# Patient Record
Sex: Female | Born: 1975 | Race: Black or African American | Hispanic: No | Marital: Single | Smoking: Current every day smoker
Health system: Southern US, Community
[De-identification: ages and names within clinical notes are randomized; demographics above are authoritative.]

## PROBLEM LIST (undated history)

## (undated) ENCOUNTER — Inpatient Hospital Stay (HOSPITAL_COMMUNITY): Payer: BC Managed Care – PPO

## (undated) DIAGNOSIS — F329 Major depressive disorder, single episode, unspecified: Secondary | ICD-10-CM

## (undated) DIAGNOSIS — D219 Benign neoplasm of connective and other soft tissue, unspecified: Secondary | ICD-10-CM

## (undated) DIAGNOSIS — IMO0002 Reserved for concepts with insufficient information to code with codable children: Secondary | ICD-10-CM

## (undated) DIAGNOSIS — I10 Essential (primary) hypertension: Secondary | ICD-10-CM

## (undated) DIAGNOSIS — R87619 Unspecified abnormal cytological findings in specimens from cervix uteri: Secondary | ICD-10-CM

## (undated) DIAGNOSIS — F32A Depression, unspecified: Secondary | ICD-10-CM

## (undated) DIAGNOSIS — E785 Hyperlipidemia, unspecified: Secondary | ICD-10-CM

## (undated) DIAGNOSIS — N390 Urinary tract infection, site not specified: Secondary | ICD-10-CM

## (undated) HISTORY — DX: Essential (primary) hypertension: I10

## (undated) HISTORY — PX: NO PAST SURGERIES: SHX2092

## (undated) HISTORY — DX: Depression, unspecified: F32.A

## (undated) HISTORY — PX: CHOLECYSTECTOMY: SHX55

## (undated) HISTORY — DX: Major depressive disorder, single episode, unspecified: F32.9

## (undated) HISTORY — DX: Urinary tract infection, site not specified: N39.0

## (undated) HISTORY — DX: Hyperlipidemia, unspecified: E78.5

---

## 1999-02-01 HISTORY — PX: CHOLECYSTECTOMY: SHX55

## 1999-11-02 ENCOUNTER — Other Ambulatory Visit: Admission: RE | Admit: 1999-11-02 | Discharge: 1999-11-02 | Payer: Self-pay | Admitting: Family Medicine

## 2000-03-31 ENCOUNTER — Other Ambulatory Visit: Admission: RE | Admit: 2000-03-31 | Discharge: 2000-03-31 | Payer: Self-pay | Admitting: Obstetrics and Gynecology

## 2000-09-15 ENCOUNTER — Other Ambulatory Visit: Admission: RE | Admit: 2000-09-15 | Discharge: 2000-09-15 | Payer: Self-pay | Admitting: Obstetrics and Gynecology

## 2000-09-15 ENCOUNTER — Inpatient Hospital Stay (HOSPITAL_COMMUNITY): Admission: AD | Admit: 2000-09-15 | Discharge: 2000-09-15 | Payer: Self-pay | Admitting: *Deleted

## 2000-10-09 ENCOUNTER — Inpatient Hospital Stay (HOSPITAL_COMMUNITY): Admission: AD | Admit: 2000-10-09 | Discharge: 2000-10-11 | Payer: Self-pay | Admitting: Obstetrics and Gynecology

## 2000-10-13 ENCOUNTER — Encounter: Payer: Self-pay | Admitting: Obstetrics and Gynecology

## 2000-10-13 ENCOUNTER — Inpatient Hospital Stay (HOSPITAL_COMMUNITY): Admission: AD | Admit: 2000-10-13 | Discharge: 2000-10-15 | Payer: Self-pay | Admitting: Obstetrics and Gynecology

## 2000-10-14 ENCOUNTER — Encounter: Payer: Self-pay | Admitting: Obstetrics and Gynecology

## 2000-11-20 ENCOUNTER — Encounter (INDEPENDENT_AMBULATORY_CARE_PROVIDER_SITE_OTHER): Payer: Self-pay | Admitting: Specialist

## 2000-11-20 ENCOUNTER — Observation Stay (HOSPITAL_COMMUNITY): Admission: RE | Admit: 2000-11-20 | Discharge: 2000-11-20 | Payer: Self-pay | Admitting: Surgery

## 2000-11-20 ENCOUNTER — Encounter: Payer: Self-pay | Admitting: Surgery

## 2001-02-20 ENCOUNTER — Encounter: Payer: Self-pay | Admitting: *Deleted

## 2001-02-20 ENCOUNTER — Emergency Department (HOSPITAL_COMMUNITY): Admission: EM | Admit: 2001-02-20 | Discharge: 2001-02-20 | Payer: Self-pay | Admitting: *Deleted

## 2004-07-05 ENCOUNTER — Ambulatory Visit (HOSPITAL_COMMUNITY): Admission: RE | Admit: 2004-07-05 | Discharge: 2004-07-05 | Payer: Self-pay | Admitting: Family Medicine

## 2006-11-07 ENCOUNTER — Other Ambulatory Visit: Admission: RE | Admit: 2006-11-07 | Discharge: 2006-11-07 | Payer: Self-pay | Admitting: Family Medicine

## 2007-03-12 ENCOUNTER — Inpatient Hospital Stay (HOSPITAL_COMMUNITY): Admission: AD | Admit: 2007-03-12 | Discharge: 2007-03-12 | Payer: Self-pay | Admitting: Obstetrics & Gynecology

## 2007-12-13 ENCOUNTER — Other Ambulatory Visit: Admission: RE | Admit: 2007-12-13 | Discharge: 2007-12-13 | Payer: Self-pay | Admitting: Family Medicine

## 2009-10-27 ENCOUNTER — Other Ambulatory Visit: Admission: RE | Admit: 2009-10-27 | Discharge: 2009-10-27 | Payer: Self-pay | Admitting: Family Medicine

## 2010-06-18 NOTE — H&P (Signed)
[facility]  Patient:    Katelyn Zimmerman, BETTENHAUSEN Visit Number: [id number] MRN: [id number]          Service Type: MED Location: [facility] Attending Physician:  Jaymes Graff A Dictated by:   Nigel Bridgeman, C.N.M. Admit Date:  10/13/2000                           History and Physical  HISTORY OF PRESENT ILLNESS:   The patient is a 35 year old, gravida 3, para 2-0-1-2, at four days post spontaneous vaginal delivery and tubal ligation, who presented tonight with a history of elevated blood pressure at home, and shortness of breath.  Her maternity care coordinator had seen her today and she had had a blood pressure of 170/110.  The patient was directed to maternity admissions for serial blood pressures and further evaluation.  She reports she has had some mild sporadic epigastric pain. She denies chest pain, cramping, heavy bleeding, or dysuria.  She is bottle feeding.  The patient is currently accompanied by her newborn infant, her older child, and the son of the father of the baby.  Due to the patients social situation, she has no other child care to assist. I did speak tonight with both the father of the baby, who is currently in [place], and his sister, who is living in [place]. The father of the baby will be coming to [facility] tomorrow evening at 9 p.m. on a flight.  The father of the baby reports that his sister will come tomorrow from [place] to assist with child care.  Currently tonight, the three children will remain with the parent, and arrangements were made for feeding and sleeping arrangements for the children.  Her pregnancy and medical history have been remarkable for; (1) mild shoulder dystocia at her delivery, (2) tubal ligation on day #1 postpartum.  OBSTETRICAL HISTORY:          The patient has had one previous full term delivery and one previous AB.  HISTORY OF PREGNANCY:         The patient entered care in the first trimester. She had  an essentially uncomplicated pregnancy except for Chlamydia in the first trimester, which was treated. She had a negative test of cure in the third trimester.  She had a spontaneous vaginal birth on October 09, 2000, with delivery of a viable female.  There was mild shoulder dystocia.  The patient had an uncomplicated postpartum course.  PAST MEDICAL HISTORY:         The patient has had occasional UTIs and the usual childhood illnesses.  There is no history of hypertension, pulmonary disease or vascular disease.  Her only other hospitalizations were for childbirth.  She has no history of surgery.  FAMILY HISTORY:               Her father has hypertension.  Her maternal grandmother had a stroke.  Her first child had rhabdosarcoma, which showed itself as ear cancer but it was essentially cured of that.  The father of the baby had sickle cell trait.  GENETIC HISTORY:              Unremarkable.  SOCIAL HISTORY:               The patient is single. The father of the baby is involved but currently in [place].  He will return tomorrow evening. The patient recently relocated from [place] from [place].  There are  no family or friends here to assist with child care.  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  There is no history of alcohol, smoking or drug use in this patient.  PHYSICAL EXAMINATION:  VITAL SIGNS:                  Blood pressure ranges from 140-160 systolic over 80-100 diastolic. O2 saturations are 95-96% on room air.  The patient appears to be in no apparent distress.  Respirations are 24, pulse is 60-70.  CHEST:                        Clear.  HEENT:                        Within normal limits.  LUNGS:                        Bilateral breath sounds are clear.  HEART:                        Regular rate and rhythm with a grade 2/4 systolic murmur.  ABDOMEN:                      Soft and nontender.  There is no rebound or guarding.  Uterus  is well involuted at approximately 12-14 week size. Lochia is minimal.  BACK:                         Negative CVA tenderness.  EXTREMITIES:                  Deep tendon reflexes are 2-3+ without clonus. There is 2-3+ edema in the lower extremities. 40 cm.  Estimated fetal weight is 8 to 8-1/2 pounds.  Uterine contractions are every 2 to 2-1/2 minutes, 60 seconds in duration and of moderate quality.  LABORATORY DATA:              ABGs show pH of 7.450, pO2 of 92, pCO2 of 29, and a bicarb of 19.2, base deficit is 3.  CBC: Hemoglobin is 11.5, hematocrit 33.9, WBC count 7.5 and platelets 234.  Clean catch urine shows specific gravity of 125, negative protein, moderate hemoglobin, small bilirubin, urobilinogen of 4, trace leukocyte esterase and positive ictotest. Comprehensive metabolic panel: Sodium 147, potassium 3.4, BUN 11, creatinine 0.8, total protein 6.8, albumin 2.8, SGOT 212, SGPT 190, alkaline phosphatase 302, total bilirubin 1.1, LDH 379, and uric acid of 5.6.  Chest x-ray shows borderline cardiomegaly, mild right lower lobe atelectasis and no edematous changes. Negative Homans sign is noted.  IMPRESSION:                   1. Status post spontaneous vaginal birth four                                  days ago with tubal ligation three days ago.                               2. Preeclampsia variant versus gallbladder  disease.                               3. Questionable pulmonary issues.  PLAN:                         1. Admitted to the [facility]                                  [place], [place] for consult with                                  Dr. Jaymes Graff as attending physician.                               2. Plan magnesium sulfate therapy at 4 g                                  bolus at 2 g/h.                               3. Repeat PIH labs of magnesium sulfate in the                                  a.m.                                4. Foley catheter and strict I&O.                               5. Gallbladder ultrasound in the a.m.                                6. V/Q scan with Doppler studies to rule out PE                                  in the a.m.                               7. MDs will follow. Dictated by:   Nigel Bridgeman, C.N.M. Attending Physician:  Michael Litter DD:  10/14/00 TD:  10/14/00 Job: [id number]

## 2010-06-18 NOTE — Op Note (Signed)
[facility]  Patient:    Katelyn Zimmerman, PEEKS Visit Number: [id number] MRN: [id number]          Service Type: SUR Location: 4W 0442 01 Attending Physician:  Charlton Haws Dictated by:   Currie Paris, M.D. Proc. Date: 11/20/00 Admit Date:  11/20/2000 Discharge Date: 11/20/2000   CC:         [id number]  Naima A. Normand Sloop, M.D.  Janine Limbo, M.D.   Operative Report  VISIT #:  [id number]  PREOPERATIVE DIAGNOSIS:  Chronic calculus cholecystitis.  POSTOPERATIVE DIAGNOSIS:  Chronic calculus cholecystitis.  OPERATIVE PROCEDURE:  Laparoscopic cholecystectomy.  SURGEON:  Currie Paris, M.D.  ASSISTANT:  Gita Kudo, M.D.  ANESTHESIA:  General endotracheal.  CLINICAL HISTORY:  The patient is a 35 year old who was hospitalized on September 13th, five days post partum with some hypertension, dyspnea and sporadic epigastric pain. Workup showed some atelectasis but she was also found to have gallstones. After a period of observation, it was felt that her gallstones were not causing any acute problem and she is scheduled now electively for laparoscopic cholecystectomy.  DESCRIPTION OF PROCEDURE:  The patient was seen in the holding area and had no further questions and was prepared for surgery. The patient was taken into the operating room and after satisfactory general endotracheal anesthesia had been obtained, the abdomen was prepped and draped. Marcaine 0.025% plain was used for each incision. An umbilical incision was made, the fascia opened and the peritoneal cavity entered. A pursestring suture was placed and the Hasson introduced. The abdomen was insufflated to 15. The camera was placed and the gallbladder was a little distended but not tense. There were some omental adhesions. The uterus appeared normal. There appeared to be a left ovarian cyst. Three additional cannulas were placed in the usual positions.  The gallbladder was retracted over the liver. Dissection was begun where I thought the gallbladder/cystic duct junction was and essentially was able to free up and find the cystic artery which was stuck posteriorly to the cystic duct. The cystic duct looked a little large and after I dissected fully around it, I opened it, introduced a cholangiocatheter and did an operative cholangiography that confirmed the anatomy. This showed a long cystic duct which was fairly large, normal common duct with good flow into the duodenum and no stones. Once the anatomy was therefore delineated, I had already opened up a big window in the triangle of Calot posterior to the gallbladder. I dissected the cystic duct down a little further and then clipped it using three clips on the stay side and one on the go side. The cystic artery was also clipped and divided. The gallbladder was removed from below to above with coagulation current of the cautery. A final irrigation showed no evidence of bile or blood leaks and everything otherwise appeared normal. The gallbladder was brought out the umbilical port. The abdomen was reinsufflated and the remaining irrigant suctioned out and a last check for hemostasis made. The lateral ports were removed. The pursestring suture was tied down at the umbilical port and the abdomen deflated through the epigastric port. The skin was closed with 4-0 Monocryl subcuticular plus Steri-Strips. The patient tolerated the procedure well. There were no operative complications. All counts were correct.Dictated by:   Currie Paris, M.D. Attending Physician:  Charlton Haws DD:  11/20/00 TD:  11/21/00 Job: [id number]

## 2010-06-18 NOTE — Discharge Summary (Signed)
[facility]  Patient:    Katelyn Zimmerman, ASK Visit Number: [id number] MRN: [id number]          Service Type: SUR Location: 4W 0442 01 Attending Physician:  Charlton Haws Dictated by:   Pierre Bali Normand Sloop, M.D. Admit Date:  11/20/2000 Discharge Date: 11/20/2000                             Discharge Summary  HISTORY:                      Patient is a 35 year old G3, para 2-0-1-2 who presented four days status post a vaginal delivery with signs of preeclampsia versus gallbladder disease.  Also had some questionable pulmonary issues. Patient was admitted to the [facility] for some observation.  She was started on some magnesium sulfate.  Is and Os were followed.  Patient was admitted for a gallbladder ultrasound which was significant for cholelithiasis.  She was then seen by a general surgeon who felt that she did have gallbladder disease and needed to schedule a cholecystectomy.  Patient never had any shortness of breath or chest pain.  Her blood pressures after 23 hours of magnesium were stable at 140s/60s-80s.  Pulse oximetry was 97 and 96% on room air. Her urine output was 2550 cc just for seven hours.  Patient was sent home to follow up with a blood pressure check.  She had an ABG which was found to be normal. She was told to have a low fat, low salt diet, limit activity, and to follow up in our office in two weeks. Dictated by:   Pierre Bali. Normand Sloop, M.D. Attending Physician:  Charlton Haws DD:  12/12/00 TD:  12/13/00 Job: [id number]

## 2010-06-18 NOTE — H&P (Signed)
[facility]  Patient:    Katelyn Zimmerman, Katelyn Zimmerman Visit Number: [id number] MRN: [id number]          Service Type: OBS Location: 910B 9162 01 Attending Physician:  Leonard Schwartz Dictated by:   Saverio Danker, C.N.M. Admit Date:  10/09/2000                           History and Physical  HISTORY OF PRESENT ILLNESS:   Katelyn Zimmerman is a 35 year old, single black female, gravida 3, para 1-0-1-1 at 49 and 3/7 weeks who presents complaining of uterine contractions every 2 to 3 minutes for the last several hours. She denies any leaking or vaginal bleeding. She denies any nausea, vomiting, headaches, or visual disturbances. She reports positive fetal movement. Her pregnancy has been followed at [facility] by the M.D. service and has been essentially uncomplicated though at risk for a history of first trimester positive Chlamydia with a negative test of ______  at 36 weeks. She is also is considering bilateral tubal ligation following this delivery.  OBSTETRICAL/GYNECOLOGICAL HISTORY:                      She is a gravida 3, para 1-0-1-1 who delivered a viable female infant in 58 who weighed 6 pounds 5 ounces at [redacted] weeks gestation following a 24-hour labor. She had no complications with that delivery. In 2001, she had an elective AB with no complications. Her other GYN history, she used Norplant from 61 to 2000. She used oral contraceptives in 2000 and condoms.  ALLERGIES:                    She is has no known drug allergies.  PAST MEDICAL HISTORY:         She reports having had the usual childhood diseases. She reports occasional urinary tract infections, and her only hospitalization was for childbirth.  FAMILY HISTORY:               Significant for a father with hypertension. Maternal grandmother with stroke and her son with rhabdosarcoma, cancer of the ear but has been cured.  GENETIC HISTORY:              Negative with the  exception of the father of the baby who has sickle cell trait.  SOCIAL HISTORY:               She is single. The father of the baby Gregary Signs.  He is involved and supportive. 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 They deny any illicit drug use, alcohol, or smoking subsequent to knowing she was pregnant.  PRENATAL LABORATORY DATA:     Her blood type is O+. Her antibody screen is negative. Sickle cell trait is negative. Syphilis is nonreactive. Rubella is immune. Hepatitis B surface antigen is negative. HIV is nonreactive. Pap showed atypical squamous cells. GC and Chlamydia were negative in April. Her 1-hour Glucola is within normal range. Her 36-week beta strep was negative.  PHYSICAL EXAMINATION:  VITAL SIGNS:                  Stable. She is afebrile.  HEENT:                        Grossly within normal limits.  HEART:  Regular rate and rhythm.  CHEST:                        Clear.  BREASTS:                      Soft and nontender.  ABDOMEN:                      Gravid with uterine contractions every 2 to 3 minutes. Her fetal heart rate is overall reassuring.  PELVIC:                       Her cervix is 4 cm, 100%, vertex, -1, with bulging membranes.  EXTREMITIES:                  Within normal limits.  ASSESSMENT:                   1. Intrauterine pregnancy at term.                               2. Active labor.                               3. Desires bilateral tubal ligation.  PLAN:                         Admit to labor and delivery to follow routine M.D. orders and Naima A. Dillard, M.D. has been notified of patients admission and will follow patient. Dictated by:   Vance Gather Duplantis, C.N.M. Attending Physician:  Leonard Schwartz DD:  10/09/00 TD:  10/09/00 Job: [id number]

## 2010-06-18 NOTE — Consult Note (Signed)
[facility]  Patient:    Katelyn Zimmerman, Katelyn Zimmerman Visit Number: [id number] MRN: [id number]          Service Type: MED Location: 9400 [id number] 01 Attending Physician:  Jaymes Graff A Dictated by:   Currie Paris, M.D. Consultation Date: 10/14/00 Admit Date:  10/13/2000   CC:         Jaymes Graff, M.D.   Consultation Report  SURGERY CONSULTATION  VISIT NUMBER:                 [id number].  CHIEF COMPLAINT:              Gallstones.  CLINICAL HISTORY:             Ms. Muller is a 35 year old woman who is about four to five days postpartum who was admitted on September 13. She presented with increased blood pressure, shortness of breath and some mild sporadic epigastric pain. She has a past history of intermittent epigastric pain sometimes radiating through to her back that has been off and on for several years and would often go several months without bothering her whatsoever and then start bothering her again. She began developing some of those during her most recent pregnancy, having been asymptomatic for several months prior to that. The pain episodes when she first started having them lasted 10 to 15 minutes but more recently, have been lasting a few hours and associated with some mild nausea. Often eating greasy foods, spaghetti, pizza would cause the episodes to occur, but other times she could eat those foods without any other problems.  PAST MEDICAL HISTORY:         Usual childhood illnesses, occasional urinary tract infection.  FAMILY HISTORY:               No significant family history.  ADMISSION PHYSICAL EXAMINATION:                  On admission, she was noted to have blood pressures ranging from 140-160/85-100, O2 saturations were ranging 95% to 96% on room air and she had a PO2 of 92. On admission also, her chest was noted to be clear. She was nonicteric. She did not have any right upper quadrant tenderness noted on her  admission  H&P.  LABORATORY STUDIES:           Laboratory studies showed slight elevation in SGOT and SGPT with alkaline phosphatase of about 302 and slight elevation of LDH. Total bilirubin was normal at 1.1.  Chest x-ray showed borderline cardiomegaly and some mild right lower lobe atelectasis but no edema.  The patient was placed in the AICU and begun on some magnesium treatments. Because of her history of some epigastric pain, a gallbladder ultrasound was obtained which shows some sludge, small stones, and questionable little fluid around the gallbladder, possibly an early acute cholecystitis. I was asked to see her.  FOLLOWUP LABORATORY STUDIES:  Followup laboratory studies were basically unchanged. Amylase is normal. Lipase is pending.  PHYSICAL EXAMINATION:  GENERAL:                      On exam today, the patient is alert, oriented and feels in no distress. She is not dyspneic. She is nonicteric.  LUNGS:                        Clear to auscultation.  HEART:  Regular. No murmurs, rubs, or gallops are heard.  ABDOMEN:                      Soft and nontender. I cannot get any tenderness even to deep palpation in the right upper quadrant. Bowel sounds are normal.  EXTREMITIES:                  Unremarkable.  IMPRESSION:                   1. Cholelithiasis with possible mild early                                  cholecystitis.                               2. Postpartum hypertension, being evaluated and                                  managed with plans for spiral CT to rule out                                  pulmonary embolism because of her dyspnea.  PLAN:                         I would follow her September 15, reexamine. If she appears stable and no other complicating factors, I think we ought to think about doing a laparoscopic cholecystectomy, and given the appearance of her ultrasound, it might be appropriate to do her fairly soon rather  than delay too much, although if she remains afebrile with a normal white count and no fever and nontender, we could possible postpone it for a week or two to give her a little bit more time postpartum. I will see her September 15 in followup. I have ordered some labs for in the morning. I believe we can let her begin diet today since I do not believe she will be requiring surgical intervention on Sunday. Dictated by:   Currie Paris, M.D. Attending Physician:  Michael Litter DD:  10/14/00 TD:  10/14/00 Job: [id number]

## 2010-10-22 LAB — WET PREP, GENITAL: Clue Cells Wet Prep HPF POC: NONE SEEN

## 2010-10-22 LAB — GC/CHLAMYDIA PROBE AMP, GENITAL
Chlamydia, DNA Probe: NEGATIVE
GC Probe Amp, Genital: NEGATIVE

## 2011-09-26 ENCOUNTER — Emergency Department (HOSPITAL_COMMUNITY)
Admission: EM | Admit: 2011-09-26 | Discharge: 2011-09-26 | Disposition: A | Discharge status: Home / Self Care | Payer: BC Managed Care – PPO | Attending: Emergency Medicine | Admitting: Emergency Medicine

## 2011-09-26 ENCOUNTER — Encounter (HOSPITAL_COMMUNITY): Payer: Self-pay | Admitting: *Deleted

## 2011-09-26 DIAGNOSIS — B9689 Other specified bacterial agents as the cause of diseases classified elsewhere: Secondary | ICD-10-CM | POA: Insufficient documentation

## 2011-09-26 DIAGNOSIS — N760 Acute vaginitis: Secondary | ICD-10-CM

## 2011-09-26 DIAGNOSIS — A499 Bacterial infection, unspecified: Secondary | ICD-10-CM | POA: Insufficient documentation

## 2011-09-26 DIAGNOSIS — F17200 Nicotine dependence, unspecified, uncomplicated: Secondary | ICD-10-CM | POA: Insufficient documentation

## 2011-09-26 LAB — URINE MICROSCOPIC-ADD ON

## 2011-09-26 LAB — URINALYSIS, ROUTINE W REFLEX MICROSCOPIC
Bilirubin Urine: NEGATIVE
Glucose, UA: NEGATIVE mg/dL
Hgb urine dipstick: NEGATIVE
Specific Gravity, Urine: 1.014 (ref 1.005–1.030)
Urobilinogen, UA: 0.2 mg/dL (ref 0.0–1.0)
pH: 5.5 (ref 5.0–8.0)

## 2011-09-26 LAB — WET PREP, GENITAL: Yeast Wet Prep HPF POC: NONE SEEN

## 2011-09-26 MED ORDER — METRONIDAZOLE 500 MG PO TABS: 500.0000 mg | ORAL_TABLET | Freq: Two times a day (BID) | ORAL | Status: AC

## 2011-09-26 NOTE — ED Provider Notes (Signed)
History     CSN: [id number]  Arrival date & time 09/26/11  0935   First MD Initiated Contact with Patient 09/26/11 1021      Chief Complaint  Patient presents with  . Urinary Tract Infection    (Consider location/radiation/quality/duration/timing/severity/associated sxs/prior treatment) HPI  36 y.o. female in no acute distress complaining of mild suprapubic discomfort and dark urine over the course of last 2 days she reports it now the dark urine has resolved. Patient denies any dysuria, urgency, foul-smelling urine, fever,  Nausea/vomiting, vaginal discharge or abdominal pain.   History reviewed. No pertinent past medical history.  Past Surgical History  Procedure Date  . Cholecystectomy     No family history on file.  History  Substance Use Topics  . Smoking status: Current Everyday Smoker  . Smokeless tobacco: Not on file  . Alcohol Use: Yes     occasionally    OB History    Grav Para Term Preterm Abortions TAB SAB Ect Mult Living                  Review of Systems  All other systems reviewed and are negative.    Allergies  Review of patient's allergies indicates no known allergies.  Home Medications   Current Outpatient Rx  Name Route Sig Dispense Refill  . ADULT MULTIVITAMIN W/MINERALS CH Oral Take 1 tablet by mouth daily.      BP 129/80  Pulse 65  Temp 98.4 F (36.9 C) (Oral)  Resp 14  SpO2 100%  LMP 09/19/2011  Physical Exam  Nursing note and vitals reviewed. Constitutional: She is oriented to person, place, and time. She appears well-developed and well-nourished. No distress.  HENT:  Head: Normocephalic and atraumatic.  Eyes: Conjunctivae and EOM are normal. Pupils are equal, round, and reactive to light.  Cardiovascular: Normal rate, regular rhythm, normal heart sounds and intact distal pulses.   Pulmonary/Chest: Effort normal and breath sounds normal.  Abdominal: Soft. Bowel sounds are normal.       No CVA tenderness bilaterally    Genitourinary: Vagina normal. Pelvic exam was performed with patient prone. There is no rash, tenderness, lesion or injury on the right labia. There is no rash, tenderness, lesion or injury on the left labia. Uterus is not deviated, not enlarged, not fixed and not tender. Cervix exhibits no motion tenderness, no discharge and no friability. Right adnexum displays no mass, no tenderness and no fullness. Left adnexum displays no mass, no tenderness and no fullness. No vaginal discharge found.       Pelvic exam is clinically normal. Chaperone present throughout exam RN Patty  Musculoskeletal: Normal range of motion.  Neurological: She is alert and oriented to person, place, and time.  Skin: Skin is warm. No rash noted.  Psychiatric: She has a normal mood and affect.    ED Course  Procedures (including critical care time)  Labs Reviewed  URINALYSIS, ROUTINE W REFLEX MICROSCOPIC - Abnormal; Notable for the following:    Protein, ur 100 (*)     All other components within normal limits  URINE MICROSCOPIC-ADD ON - Abnormal; Notable for the following:    Squamous Epithelial / LPF FEW (*)     Bacteria, UA FEW (*)     All other components within normal limits  WET PREP, GENITAL - Abnormal; Notable for the following:    Clue Cells Wet Prep HPF POC FEW (*)     WBC, Wet Prep HPF POC FEW (*)  All other components within normal limits  POCT PREGNANCY, URINE  PREGNANCY, URINE  URINE CULTURE  GC/CHLAMYDIA PROBE AMP, GENITAL   No results found.   1. Bacterial vaginosis       MDM  36 year old female reporting suprapubic discomfort and dark urine no results. Urinalysis is within normal limits physical exam is benign pelvic exam shows no abnormalities. Patient has a few clue cells I will treat her for bacterial vaginosis with Flagyl and give her a resource guide to establish primary care.        Wynetta Emery, PA-C 09/26/11 1457

## 2011-09-26 NOTE — ED Notes (Signed)
Pt reports suprapubic and bil flank pain since last week. Frequent urination, cloudy, odorous urine.

## 2011-09-27 LAB — URINE CULTURE: Special Requests: NORMAL

## 2011-09-27 LAB — GC/CHLAMYDIA PROBE AMP, GENITAL
Chlamydia, DNA Probe: NEGATIVE
GC Probe Amp, Genital: NEGATIVE

## 2011-09-27 NOTE — ED Provider Notes (Signed)
Medical screening examination/treatment/procedure(s) were performed by non-physician practitioner and as supervising physician I was immediately available for consultation/collaboration.  Tobin Chad, MD 09/27/11 [phone]

## 2011-09-28 NOTE — ED Notes (Signed)
+   Urine Chart sent to EDP office for review. 

## 2011-11-25 ENCOUNTER — Other Ambulatory Visit (HOSPITAL_COMMUNITY)
Admission: RE | Admit: 2011-11-25 | Discharge: 2011-11-25 | Disposition: A | Discharge status: Home / Self Care | Payer: BC Managed Care – PPO | Source: Ambulatory Visit | Attending: Family Medicine | Admitting: Family Medicine

## 2011-11-25 DIAGNOSIS — Z1151 Encounter for screening for human papillomavirus (HPV): Secondary | ICD-10-CM | POA: Insufficient documentation

## 2011-11-25 DIAGNOSIS — Z01419 Encounter for gynecological examination (general) (routine) without abnormal findings: Secondary | ICD-10-CM | POA: Insufficient documentation

## 2012-07-05 ENCOUNTER — Emergency Department (HOSPITAL_COMMUNITY)
Admission: EM | Admit: 2012-07-05 | Discharge: 2012-07-05 | Disposition: A | Discharge status: Home / Self Care | Payer: BC Managed Care – PPO | Attending: Emergency Medicine | Admitting: Emergency Medicine

## 2012-07-05 ENCOUNTER — Encounter (HOSPITAL_COMMUNITY): Payer: Self-pay | Admitting: Emergency Medicine

## 2012-07-05 DIAGNOSIS — R300 Dysuria: Secondary | ICD-10-CM | POA: Insufficient documentation

## 2012-07-05 DIAGNOSIS — F17200 Nicotine dependence, unspecified, uncomplicated: Secondary | ICD-10-CM | POA: Insufficient documentation

## 2012-07-05 DIAGNOSIS — N390 Urinary tract infection, site not specified: Secondary | ICD-10-CM | POA: Insufficient documentation

## 2012-07-05 DIAGNOSIS — Z9089 Acquired absence of other organs: Secondary | ICD-10-CM | POA: Insufficient documentation

## 2012-07-05 DIAGNOSIS — R3915 Urgency of urination: Secondary | ICD-10-CM | POA: Insufficient documentation

## 2012-07-05 DIAGNOSIS — Z3202 Encounter for pregnancy test, result negative: Secondary | ICD-10-CM | POA: Insufficient documentation

## 2012-07-05 LAB — URINALYSIS, ROUTINE W REFLEX MICROSCOPIC
Nitrite: NEGATIVE
Specific Gravity, Urine: 1.015 (ref 1.005–1.030)
Urobilinogen, UA: 0.2 mg/dL (ref 0.0–1.0)

## 2012-07-05 LAB — URINE MICROSCOPIC-ADD ON

## 2012-07-05 LAB — POCT PREGNANCY, URINE: Preg Test, Ur: NEGATIVE

## 2012-07-05 MED ORDER — NITROFURANTOIN MONOHYD MACRO 100 MG PO CAPS: 100.0000 mg | ORAL_CAPSULE | Freq: Two times a day (BID) | ORAL | Status: DC

## 2012-07-05 NOTE — ED Notes (Signed)
Pt c/o lower abd pain and burning with urination x 3 days

## 2012-07-05 NOTE — ED Notes (Signed)
Advised of the wait time 

## 2012-07-06 NOTE — ED Provider Notes (Signed)
History     CSN: [id number]  Arrival date & time 07/05/12  1844   None     Chief Complaint  Patient presents with  . Abdominal Pain  . Dysuria    (Consider location/radiation/quality/duration/timing/severity/associated sxs/prior treatment) HPI History provided by pt.   Pt c/o dysuria and urgency x 3 days.  Associated w/ diffuse lower abd pain w/ radiation to the back that occurs preceding urination and resolves immediately after.  No associated fever, N/V or vaginal sx.  No prior h/o UTI.   History reviewed. No pertinent past medical history.  Past Surgical History  Procedure Laterality Date  . Cholecystectomy      History reviewed. No pertinent family history.  History  Substance Use Topics  . Smoking status: Current Every Day Smoker  . Smokeless tobacco: Not on file  . Alcohol Use: Yes     Comment: occasionally    OB History   Grav Para Term Preterm Abortions TAB SAB Ect Mult Living                  Review of Systems  All other systems reviewed and are negative.    Allergies  Review of patient's allergies indicates no known allergies.  Home Medications   Current Outpatient Rx  Name  Route  Sig  Dispense  Refill  . Multiple Vitamin (MULTIVITAMIN WITH MINERALS) TABS   Oral   Take 1 tablet by mouth daily.         . nitrofurantoin, macrocrystal-monohydrate, (MACROBID) 100 MG capsule   Oral   Take 1 capsule (100 mg total) by mouth 2 (two) times daily.   14 capsule   0     BP 132/72  Pulse 74  Temp(Src) 98.2 F (36.8 C) (Oral)  Resp 18  SpO2 100%  Physical Exam  Nursing note and vitals reviewed. Constitutional: She is oriented to person, place, and time. She appears well-developed and well-nourished. No distress.  HENT:  Head: Normocephalic and atraumatic.  Eyes:  Normal appearance  Neck: Normal range of motion.  Cardiovascular: Normal rate and regular rhythm.   Pulmonary/Chest: Effort normal and breath sounds normal. No respiratory  distress.  Abdominal: Soft. Bowel sounds are normal. She exhibits no distension and no mass. There is no tenderness. There is no rebound and no guarding.  Genitourinary:  No CVA tenderness  Musculoskeletal: Normal range of motion.  Neurological: She is alert and oriented to person, place, and time.  Skin: Skin is warm and dry. No rash noted.  Psychiatric: She has a normal mood and affect. Her behavior is normal.    ED Course  Procedures (including critical care time)  Labs Reviewed  URINALYSIS, ROUTINE W REFLEX MICROSCOPIC - Abnormal; Notable for the following:    APPearance CLOUDY (*)    Hgb urine dipstick SMALL (*)    Protein, ur 100 (*)    Leukocytes, UA LARGE (*)    All other components within normal limits  URINE MICROSCOPIC-ADD ON - Abnormal; Notable for the following:    Squamous Epithelial / LPF MANY (*)    All other components within normal limits  POCT PREGNANCY, URINE   No results found.   1. UTI (lower urinary tract infection)       MDM  37yo healthy F presents w/ dysuria and urgency x 3d.  Afebrile, non-toxic appearing, abd benign on exam.  U/A positive for infection.  Low clinical suspicion for pyelo.  Prescribed macrobid.  Return precautions discussed.  Otilio Miu, PA-C 07/06/12 [phone]

## 2012-07-07 NOTE — ED Provider Notes (Signed)
Medical screening examination/treatment/procedure(s) were performed by non-physician practitioner and as supervising physician I was immediately available for consultation/collaboration.   Joya Gaskins, MD 07/07/12 [phone]

## 2012-10-02 LAB — OB RESULTS CONSOLE ANTIBODY SCREEN: Antibody Screen: NEGATIVE

## 2012-10-02 LAB — OB RESULTS CONSOLE ABO/RH: RH Type: POSITIVE

## 2012-11-28 ENCOUNTER — Other Ambulatory Visit: Payer: Self-pay

## 2012-12-25 ENCOUNTER — Inpatient Hospital Stay (HOSPITAL_COMMUNITY)
Admission: AD | Admit: 2012-12-25 | Discharge: 2012-12-25 | Disposition: A | Discharge status: Home / Self Care | Payer: BC Managed Care – PPO | Source: Ambulatory Visit | Attending: Obstetrics and Gynecology | Admitting: Obstetrics and Gynecology

## 2012-12-25 ENCOUNTER — Encounter (HOSPITAL_COMMUNITY): Payer: Self-pay | Admitting: *Deleted

## 2012-12-25 ENCOUNTER — Inpatient Hospital Stay (HOSPITAL_COMMUNITY): Payer: BC Managed Care – PPO

## 2012-12-25 DIAGNOSIS — O99891 Other specified diseases and conditions complicating pregnancy: Secondary | ICD-10-CM | POA: Insufficient documentation

## 2012-12-25 DIAGNOSIS — O9989 Other specified diseases and conditions complicating pregnancy, childbirth and the puerperium: Secondary | ICD-10-CM

## 2012-12-25 DIAGNOSIS — O26839 Pregnancy related renal disease, unspecified trimester: Secondary | ICD-10-CM | POA: Insufficient documentation

## 2012-12-25 DIAGNOSIS — O1212 Gestational proteinuria, second trimester: Secondary | ICD-10-CM

## 2012-12-25 DIAGNOSIS — R0602 Shortness of breath: Secondary | ICD-10-CM | POA: Insufficient documentation

## 2012-12-25 DIAGNOSIS — O26892 Other specified pregnancy related conditions, second trimester: Secondary | ICD-10-CM

## 2012-12-25 HISTORY — DX: Reserved for concepts with insufficient information to code with codable children: IMO0002

## 2012-12-25 HISTORY — DX: Benign neoplasm of connective and other soft tissue, unspecified: D21.9

## 2012-12-25 HISTORY — DX: Unspecified abnormal cytological findings in specimens from cervix uteri: R87.619

## 2012-12-25 LAB — CBC
HCT: 34.9 % — ABNORMAL LOW (ref 36.0–46.0)
MCHC: 34.1 g/dL (ref 30.0–36.0)
MCV: 84.9 fL (ref 78.0–100.0)
RDW: 14.1 % (ref 11.5–15.5)

## 2012-12-25 LAB — COMPREHENSIVE METABOLIC PANEL
Albumin: 2.8 g/dL — ABNORMAL LOW (ref 3.5–5.2)
BUN: 12 mg/dL (ref 6–23)
Creatinine, Ser: 0.76 mg/dL (ref 0.50–1.10)
Total Bilirubin: 0.2 mg/dL — ABNORMAL LOW (ref 0.3–1.2)
Total Protein: 7 g/dL (ref 6.0–8.3)

## 2012-12-25 LAB — URIC ACID: Uric Acid, Serum: 5.4 mg/dL (ref 2.4–7.0)

## 2012-12-25 LAB — LACTATE DEHYDROGENASE: LDH: 201 U/L (ref 94–250)

## 2012-12-25 NOTE — MAU Note (Signed)
SOB since October. Was occurring with last prenatal visit and still today. Dr. Henderson Cloud discussed with patient and sent her to MAU for further evaluation.

## 2012-12-25 NOTE — MAU Provider Note (Signed)
HPI:  Katelyn Zimmerman is a 37 y.o. female [phone] at [redacted]w[redacted]d who was sent here from the office for further evaluation of SOB that she has been experiencing for three months. The patient felt like today the SOB became worse.  The patient was sent with orders, I was asked to do a medical screen on the the patient.    RN note:  SOB since October. Was occurring with last prenatal visit and still today. Dr. Henderson Cloud discussed with patient and sent her to MAU for further evaluation.    Objective:   GENERAL: Well-developed, well-nourished female in no acute distress.  HEENT: Normocephalic, atraumatic.   LUNGS: Effort normal, clear in all lung fields, no shortness of breath noted HEART: Regular rate SKIN: Warm, dry and without erythema PSYCH: Normal mood and affect  Filed Vitals:   12/25/12 1418  BP: 109/59  Pulse: 79  Temp:   Resp: 18    Results for orders placed during the hospital encounter of 12/25/12 (from the past 24 hour(s))  CBC     Status: Abnormal   Collection Time    12/25/12 12:56 PM      Result Value Range   WBC 9.4  4.0 - 10.5 K/uL   RBC 4.11  3.87 - 5.11 MIL/uL   Hemoglobin 11.9 (*) 12.0 - 15.0 g/dL   HCT 45.4 (*) 09.8 - 11.9 %   MCV 84.9  78.0 - 100.0 fL   MCH 29.0  26.0 - 34.0 pg   MCHC 34.1  30.0 - 36.0 g/dL   RDW 14.7  82.9 - 56.2 %   Platelets 249  150 - 400 K/uL  COMPREHENSIVE METABOLIC PANEL     Status: Abnormal   Collection Time    12/25/12 12:56 PM      Result Value Range   Sodium 135  135 - 145 mEq/L   Potassium 3.7  3.5 - 5.1 mEq/L   Chloride 101  96 - 112 mEq/L   CO2 22  19 - 32 mEq/L   Glucose, Bld 81  70 - 99 mg/dL   BUN 12  6 - 23 mg/dL   Creatinine, Ser 1.30  0.50 - 1.10 mg/dL   Calcium 9.3  8.4 - 86.5 mg/dL   Total Protein 7.0  6.0 - 8.3 g/dL   Albumin 2.8 (*) 3.5 - 5.2 g/dL   AST 35  0 - 37 U/L   ALT 30  0 - 35 U/L   Alkaline Phosphatase 74  39 - 117 U/L   Total Bilirubin 0.2 (*) 0.3 - 1.2 mg/dL   GFR calc non Af Amer >90  >90  mL/min   GFR calc Af Amer >90  >90 mL/min  LACTATE DEHYDROGENASE     Status: None   Collection Time    12/25/12 12:56 PM      Result Value Range   LDH 201  94 - 250 U/L  URIC ACID     Status: None   Collection Time    12/25/12 12:56 PM      Result Value Range   Uric Acid, Serum 5.4  2.4 - 7.0 mg/dL    MDM: Pt was sent with orders by Dr. Henderson Cloud, I was asked to do a medical screen. Dr. Henderson Cloud asked me to lay my eyes on the patient one additional time prior to discharge home. Pt was instructed to start a 24 hour urine and return to the office tomorrow per Dr. Henderson Cloud. The patient states she will  do the best she can to complete the 24 hour urine as she has to pick her child up from [facility].  Pt is currently sitting up in the bed texting on her cell phone, no sign of distress.   A: 1. Shortness of breath due to pregnancy, second trimester   2. Proteinuria complicating pregnancy in second trimester    P: Discharge home per Dr. Henderson Cloud Return 24 hour urine to the office tomorrow- this was highly stressed to the patient. Pt voices understanding Return to MAU with worsening symptoms  Iona Hansen Densel Kronick, NP 12/25/2012 5:57 PM

## 2013-01-22 ENCOUNTER — Encounter (HOSPITAL_COMMUNITY): Payer: Self-pay

## 2013-01-22 ENCOUNTER — Ambulatory Visit (HOSPITAL_COMMUNITY)
Admission: RE | Admit: 2013-01-22 | Discharge: 2013-01-22 | Disposition: A | Discharge status: Home / Self Care | Payer: BC Managed Care – PPO | Source: Ambulatory Visit | Attending: Obstetrics and Gynecology | Admitting: Obstetrics and Gynecology

## 2013-01-22 VITALS — BP 129/57 | HR 97 | Wt 201.0 lb

## 2013-01-22 DIAGNOSIS — O1210 Gestational proteinuria, unspecified trimester: Secondary | ICD-10-CM

## 2013-01-22 DIAGNOSIS — O26839 Pregnancy related renal disease, unspecified trimester: Secondary | ICD-10-CM | POA: Insufficient documentation

## 2013-01-22 DIAGNOSIS — Z87891 Personal history of nicotine dependence: Secondary | ICD-10-CM | POA: Insufficient documentation

## 2013-01-22 NOTE — Progress Notes (Signed)
MATERNAL FETAL MEDICINE CONSULT  Patient Name: Katelyn Zimmerman Medical Record Number:  [id number] Date of Birth: 06/12/1975 Requesting Physician Name:  Katelyn Aland, MD Date of Service: 01/22/2013  Chief Complaint Proteinuria in pregnancy  History of Present Illness Katelyn Zimmerman was seen today secondary to proteinuria at the request of Katelyn Aland, MD.  The patient is a 37 y.o. [phone] [redacted]w[redacted]d with an EDD of 04/26/2013, by Last Menstrual Period dating method.  She was found to have 2+ proteinuria at a routine prenatal visit.  Subsequent 24 hour urine collection showed 445 mg of protein.  She has been normotensive throughout this pregnancy.  She reports that she had transient hypertension after the birth of her second child that did not require medication and resolved spontaneously within 6 weeks postpartum.  She has no history of diabetes.  Review of Systems Pertinent items are noted in HPI.  Patient History OB History  Gravida Para Term Preterm AB SAB TAB Ectopic Multiple Living  5 2 2  2 1 1   2     # Outcome Date GA Lbr Len/2nd Weight Sex Delivery Anes PTL Lv  5 CUR           4 TAB 2006          3 SAB 2006          2 TRM 2002     SVD        Comments: Post-partum hypertension resolved within 6 weeks  1 TRM 1993     SVD         Past Medical History  Diagnosis Date  . Abnormal Pap smear   . Fibroid     Past Surgical History  Procedure Laterality Date  . Cholecystectomy      History   Social History  . Marital Status: Single    Spouse Name: N/A    Number of Children: N/A  . Years of Education: N/A   Social History Main Topics  . Smoking status: Former Smoker    Quit date: 08/24/2012  . Smokeless tobacco: Not on file  . Alcohol Use: Yes     Comment: occasionally  . Drug Use: No  . Sexual Activity: Not on file   Other Topics Concern  . Not on file   Social History Narrative  . No narrative on file    Family History [name] has no  family history of mental retardation, birth defects, or genetic diseases.  Multiple members of her family have hypertension and diabetes mellitus.  Physical Examination Filed Vitals:   01/22/13 0932  BP: 129/57  Pulse: 97    Assessment and Recommendations 1.  Proteinuria.  At this time [name] feels and appears well and has no evidence of hypertension.  Thus, I do not suspect preeclampsia at this time, although the proteinuria could represent a very early sign of evolving preeclampsia.  There is no obvious cause of the proteinuria as [name] has no personal history of hypertension or diabetes mellitus, common causes of nephropathy.  [name] reports she is scheduled to have 1 hour glucose tolerance test soon, which will be determine if she has gestational diabetes, and possibly occult pre-gestational diabetes.  Regardless of etiology, the low level of proteinuria is unlikely to be of consequence in this pregnancy, unless of course she develops preeclampsia.  However, given the risk of underlying renal or other systemic disease [name] should have serial ultrasounds for growth approximately every 4 weeks  to screen for fetal growth restriction.  If growth restriction develops, I recommend twice weekly fetal testing and delivery at 37 weeks, unless indicated before then.  If fetal growth is normal there is no need to alter standard obstetrical management in terms of time and mode of delivery.  A consult with Nephrology would be reasonable, but not essential at this time unless proteinuria worsens.  If the proteinuria persists past 6 weeks post-partum, a Nephrology consult is clearly indicated for diagnostic workup and treatment.   I spent 20 minutes with [name] today of which 50% was face-to-face counseling.  Thank you for referring [name] to the [facility].  Please do not hesitate to contact us with questions.   Rema Fendt, MD

## 2013-01-22 NOTE — Addendum Note (Signed)
Encounter addended by: Alessandra Bevels. Chase Picket, RN on: 01/22/2013  4:58 PM<BR>     Documentation filed: Charges VN

## 2013-01-31 NOTE — L&D Delivery Note (Signed)
Delivery Note At 11:02 AM a viable female was delivered via Vaginal, Spontaneous Delivery (Presentation: Left Occiput Anterior).  APGAR: 8, 9; weight pending.   Placenta status: Intact, Spontaneous Pathology.  Cord: 3 vessels with the following complications  Anesthesia: Epidural  Episiotomy: None Lacerations: None Suture Repair: None Est. Blood Loss (mL): 250 cc  Mom to postpartum.  Baby to Couplet care / Skin to Skin.  Katelyn Zimmerman. 04/19/2013, 12:10 PM

## 2013-04-18 ENCOUNTER — Encounter (HOSPITAL_COMMUNITY): Payer: Self-pay

## 2013-04-18 ENCOUNTER — Inpatient Hospital Stay (HOSPITAL_COMMUNITY)
Admission: AD | Admit: 2013-04-18 | Discharge: 2013-04-21 | DRG: 775 | Disposition: A | Discharge status: Home / Self Care | Payer: BC Managed Care – PPO | Source: Ambulatory Visit | Attending: Obstetrics and Gynecology | Admitting: Obstetrics and Gynecology

## 2013-04-18 DIAGNOSIS — O99892 Other specified diseases and conditions complicating childbirth: Secondary | ICD-10-CM | POA: Diagnosis present

## 2013-04-18 DIAGNOSIS — O09529 Supervision of elderly multigravida, unspecified trimester: Secondary | ICD-10-CM | POA: Diagnosis present

## 2013-04-18 DIAGNOSIS — Z22330 Carrier of Group B streptococcus: Secondary | ICD-10-CM

## 2013-04-18 DIAGNOSIS — O9989 Other specified diseases and conditions complicating pregnancy, childbirth and the puerperium: Secondary | ICD-10-CM

## 2013-04-18 DIAGNOSIS — O139 Gestational [pregnancy-induced] hypertension without significant proteinuria, unspecified trimester: Principal | ICD-10-CM | POA: Diagnosis present

## 2013-04-18 DIAGNOSIS — O359XX0 Maternal care for (suspected) fetal abnormality and damage, unspecified, not applicable or unspecified: Secondary | ICD-10-CM

## 2013-04-18 DIAGNOSIS — Z87891 Personal history of nicotine dependence: Secondary | ICD-10-CM

## 2013-04-18 LAB — COMPREHENSIVE METABOLIC PANEL
ALBUMIN: 2.7 g/dL — AB (ref 3.5–5.2)
ALT: 22 U/L (ref 0–35)
AST: 27 U/L (ref 0–37)
Alkaline Phosphatase: 189 U/L — ABNORMAL HIGH (ref 39–117)
BUN: 8 mg/dL (ref 6–23)
CHLORIDE: 99 meq/L (ref 96–112)
CO2: 20 mEq/L (ref 19–32)
Calcium: 9.6 mg/dL (ref 8.4–10.5)
Creatinine, Ser: 0.7 mg/dL (ref 0.50–1.10)
Glucose, Bld: 74 mg/dL (ref 70–99)
POTASSIUM: 3.8 meq/L (ref 3.7–5.3)
SODIUM: 134 meq/L — AB (ref 137–147)
Total Bilirubin: 0.3 mg/dL (ref 0.3–1.2)
Total Protein: 6.3 g/dL (ref 6.0–8.3)

## 2013-04-18 LAB — PROTEIN / CREATININE RATIO, URINE
Creatinine, Urine: 35.81 mg/dL
PROTEIN CREATININE RATIO: 0.91 — AB (ref 0.00–0.15)
TOTAL PROTEIN, URINE: 32.6 mg/dL

## 2013-04-18 LAB — TYPE AND SCREEN
ABO/RH(D): O POS
Antibody Screen: NEGATIVE

## 2013-04-18 LAB — CBC
HCT: 37.1 % (ref 36.0–46.0)
HEMOGLOBIN: 12.7 g/dL (ref 12.0–15.0)
MCH: 29 pg (ref 26.0–34.0)
MCHC: 34.2 g/dL (ref 30.0–36.0)
MCV: 84.7 fL (ref 78.0–100.0)
Platelets: 206 10*3/uL (ref 150–400)
RBC: 4.38 MIL/uL (ref 3.87–5.11)
RDW: 15.4 % (ref 11.5–15.5)
WBC: 6.6 10*3/uL (ref 4.0–10.5)

## 2013-04-18 LAB — RAPID HIV SCREEN (WH-MAU): SUDS RAPID HIV SCREEN: NONREACTIVE

## 2013-04-18 LAB — OB RESULTS CONSOLE HEPATITIS B SURFACE ANTIGEN: Hepatitis B Surface Ag: NEGATIVE

## 2013-04-18 LAB — OB RESULTS CONSOLE RPR: RPR: NONREACTIVE

## 2013-04-18 LAB — OB RESULTS CONSOLE GC/CHLAMYDIA
CHLAMYDIA, DNA PROBE: NEGATIVE
GC PROBE AMP, GENITAL: NEGATIVE

## 2013-04-18 LAB — OB RESULTS CONSOLE RUBELLA ANTIBODY, IGM: RUBELLA: IMMUNE

## 2013-04-18 LAB — OB RESULTS CONSOLE HIV ANTIBODY (ROUTINE TESTING): HIV: NONREACTIVE

## 2013-04-18 LAB — OB RESULTS CONSOLE GBS: STREP GROUP B AG: POSITIVE

## 2013-04-18 LAB — LACTATE DEHYDROGENASE: LDH: 247 U/L (ref 94–250)

## 2013-04-18 LAB — URIC ACID: Uric Acid, Serum: 5.8 mg/dL (ref 2.4–7.0)

## 2013-04-18 MED ORDER — OXYTOCIN 40 UNITS IN LACTATED RINGERS INFUSION - SIMPLE MED
62.5000 mL/h | INTRAVENOUS | Status: DC
  Filled 2013-04-18: qty 1000

## 2013-04-18 MED ORDER — LIDOCAINE HCL (PF) 1 % IJ SOLN
30.0000 mL | INTRAMUSCULAR | Status: DC | PRN
  Filled 2013-04-18: qty 30

## 2013-04-18 MED ORDER — LACTATED RINGERS IV SOLN: 500.0000 mL | INTRAVENOUS | Status: DC | PRN

## 2013-04-18 MED ORDER — LACTATED RINGERS IV SOLN
INTRAVENOUS | Status: DC
  Administered 2013-04-18 – 2013-04-19 (×2): via INTRAVENOUS

## 2013-04-18 MED ORDER — ONDANSETRON HCL 4 MG/2ML IJ SOLN
4.0000 mg | Freq: Four times a day (QID) | INTRAMUSCULAR | Status: DC | PRN
Start: 2013-04-18 — End: 2013-04-19

## 2013-04-18 MED ORDER — OXYTOCIN BOLUS FROM INFUSION
500.0000 mL | INTRAVENOUS | Status: DC
  Administered 2013-04-19: 500 mL via INTRAVENOUS

## 2013-04-18 MED ORDER — MISOPROSTOL 25 MCG QUARTER TABLET
25.0000 ug | ORAL_TABLET | ORAL | Status: DC
  Administered 2013-04-18 – 2013-04-19 (×2): 25 ug via VAGINAL
  Filled 2013-04-18 (×3): qty 0.25

## 2013-04-18 MED ORDER — PENICILLIN G POTASSIUM 5000000 UNITS IJ SOLR
5.0000 10*6.[IU] | Freq: Once | INTRAVENOUS | Status: AC
  Administered 2013-04-18: 5 10*6.[IU] via INTRAVENOUS
  Filled 2013-04-18: qty 5

## 2013-04-18 MED ORDER — IBUPROFEN 600 MG PO TABS
600.0000 mg | ORAL_TABLET | Freq: Four times a day (QID) | ORAL | Status: DC | PRN
  Administered 2013-04-19: 600 mg via ORAL
  Filled 2013-04-18: qty 1

## 2013-04-18 MED ORDER — ACETAMINOPHEN 325 MG PO TABS: 650.0000 mg | ORAL_TABLET | ORAL | Status: DC | PRN

## 2013-04-18 MED ORDER — PENICILLIN G POTASSIUM 5000000 UNITS IJ SOLR
2.5000 10*6.[IU] | INTRAVENOUS | Status: DC
  Administered 2013-04-19 (×3): 2.5 10*6.[IU] via INTRAVENOUS
  Filled 2013-04-18 (×5): qty 2.5

## 2013-04-18 MED ORDER — CITRIC ACID-SODIUM CITRATE 334-500 MG/5ML PO SOLN
30.0000 mL | ORAL | Status: DC | PRN
Start: 2013-04-18 — End: 2013-04-19

## 2013-04-18 MED ORDER — OXYCODONE-ACETAMINOPHEN 5-325 MG PO TABS: 1.0000 | ORAL_TABLET | ORAL | Status: DC | PRN

## 2013-04-18 NOTE — H&P (Signed)
Katelyn Zimmerman is a 38 y.o. female presenting for Induction of labor. Maternal Medical History:  Reason for admission: Nausea. 48 y0 G5P2022 at 38 weeks 6 days sent from office to labor and delivery for induction of labor secondary to pregnancy induced hypertension and proteinuria.  Patient has had proteinuria throughout her pregnancy and has been followed by MFM. Patient had been normotensive until today at which time her BP in the office was 142/90 and she had 2+ proteinuria. Patient is asymptomatic without neurologic symptoms. She denies headache, blurry vision, no RUQ pain. She denies contractions, no loss of fluid, no vaginal bleeding and notes good fetal movements.   Contractions: Frequency: rare.    Fetal activity: Perceived fetal activity is normal.    Prenatal complications: PIH.   Prenatal Complications - Diabetes: none.    OB History   Grav Para Term Preterm Abortions TAB SAB Ect Mult Living   5 2 2  2 1 1   2      Past Medical History  Diagnosis Date  . Abnormal Pap smear   . Fibroid    Past Surgical History  Procedure Laterality Date  . Cholecystectomy    . No past surgeries     Family History: family history is not on file. Social History:  reports that she quit smoking about 7 months ago. She does not have any smokeless tobacco history on file. She reports that she does not drink alcohol or use illicit drugs.   Prenatal Transfer Tool  Maternal Diabetes: No Genetic Screening: Normal Panorama 46XX Maternal Ultrasounds/Referrals: Normal Fetal Ultrasounds or other Referrals:  Referred to Materal Fetal Medicine  Maternal Substance Abuse:  No Significant Maternal Medications:  Meds include: Other: Prenatal vitamins Significant Maternal Lab Results:  None Other Comments:  None  Review of Systems  Eyes: Negative for blurred vision, double vision and photophobia.  Gastrointestinal: Negative for nausea and vomiting.  Neurological: Negative for dizziness, tremors  and headaches.  All other systems reviewed and are negative.      Last menstrual period 07/20/2012. Maternal Exam:  Uterine Assessment: Contraction frequency is irregular.   Abdomen: Patient reports no abdominal tenderness. Fundal height is 39cm.   Estimated fetal weight is 3300 grans.   Fetal presentation: vertex  Introitus: Normal vulva. Normal vagina.  Ferning test: not done.  Nitrazine test: not done.  Pelvis: adequate for delivery.   Cervix: Cervix evaluated by digital exam.   In the office her cervical exam was closed / 30%/-3  Physical Exam  Vitals reviewed. Constitutional: She appears well-developed and well-nourished. No distress.  HENT:  Head: Normocephalic.  Eyes: Pupils are equal, round, and reactive to light.  Cardiovascular: Normal rate, regular rhythm and normal heart sounds.   Respiratory: Effort normal.  GI: Soft.  Neurological: She has normal reflexes.  Skin: Skin is warm. She is not diaphoretic.    Prenatal labs: ABO, Rh:   O+ Antibody:  negative Rubella:  Immune RPR:   NR HBsAg:   Neg HIV:   Neg GBS:   Positive sensitive to PCN   Assessment/Plan: 38 yo G5P2 with pregnancy induced hypertension, proteinuria on urine dip Admit to Labor and Delivery for induction of labor Continuous monitoring IV hydration Misoprostol for induction Epidural at maternal request [place] labs to be sent, if abnormal will start magnesium sulfate    Kynedi Profitt, [place] 04/18/2013, 8:18 PM

## 2013-04-19 ENCOUNTER — Inpatient Hospital Stay (HOSPITAL_COMMUNITY): Payer: BC Managed Care – PPO | Admitting: Anesthesiology

## 2013-04-19 ENCOUNTER — Encounter (HOSPITAL_COMMUNITY): Payer: Self-pay | Admitting: Anesthesiology

## 2013-04-19 ENCOUNTER — Encounter (HOSPITAL_COMMUNITY): Payer: BC Managed Care – PPO | Admitting: Anesthesiology

## 2013-04-19 LAB — CBC
HCT: 37.1 % (ref 36.0–46.0)
Hemoglobin: 12.7 g/dL (ref 12.0–15.0)
MCH: 29.1 pg (ref 26.0–34.0)
MCHC: 34.2 g/dL (ref 30.0–36.0)
MCV: 85.1 fL (ref 78.0–100.0)
PLATELETS: 206 10*3/uL (ref 150–400)
RBC: 4.36 MIL/uL (ref 3.87–5.11)
RDW: 15.4 % (ref 11.5–15.5)
WBC: 7 10*3/uL (ref 4.0–10.5)

## 2013-04-19 LAB — RPR: RPR Ser Ql: NONREACTIVE

## 2013-04-19 LAB — ABO/RH: ABO/RH(D): O POS

## 2013-04-19 MED ORDER — LACTATED RINGERS IV SOLN
500.0000 mL | Freq: Once | INTRAVENOUS | Status: AC
  Administered 2013-04-19: 500 mL via INTRAVENOUS

## 2013-04-19 MED ORDER — PRENATAL MULTIVITAMIN CH
1.0000 | ORAL_TABLET | Freq: Every day | ORAL | Status: DC
  Administered 2013-04-20 – 2013-04-21 (×2): 1 via ORAL
  Filled 2013-04-19 (×2): qty 1

## 2013-04-19 MED ORDER — SIMETHICONE 80 MG PO CHEW: 80.0000 mg | CHEWABLE_TABLET | ORAL | Status: DC | PRN

## 2013-04-19 MED ORDER — ONDANSETRON HCL 4 MG PO TABS: 4.0000 mg | ORAL_TABLET | ORAL | Status: DC | PRN

## 2013-04-19 MED ORDER — EPHEDRINE 5 MG/ML INJ
10.0000 mg | INTRAVENOUS | Status: DC | PRN
  Filled 2013-04-19: qty 4

## 2013-04-19 MED ORDER — PHENYLEPHRINE 40 MCG/ML (10ML) SYRINGE FOR IV PUSH (FOR BLOOD PRESSURE SUPPORT): 80.0000 ug | PREFILLED_SYRINGE | INTRAVENOUS | Status: DC | PRN

## 2013-04-19 MED ORDER — DIBUCAINE 1 % RE OINT
1.0000 "application " | TOPICAL_OINTMENT | RECTAL | Status: DC | PRN
  Filled 2013-04-19: qty 28

## 2013-04-19 MED ORDER — DIPHENHYDRAMINE HCL 25 MG PO CAPS
25.0000 mg | ORAL_CAPSULE | Freq: Four times a day (QID) | ORAL | Status: DC | PRN
Start: 2013-04-19 — End: 2013-04-21

## 2013-04-19 MED ORDER — BUTORPHANOL TARTRATE 1 MG/ML IJ SOLN
1.0000 mg | INTRAMUSCULAR | Status: DC | PRN
  Administered 2013-04-19: 1 mg via INTRAVENOUS
  Filled 2013-04-19: qty 1

## 2013-04-19 MED ORDER — SENNOSIDES-DOCUSATE SODIUM 8.6-50 MG PO TABS
2.0000 | ORAL_TABLET | ORAL | Status: DC
  Administered 2013-04-19 – 2013-04-20 (×2): 2 via ORAL
  Filled 2013-04-19 (×2): qty 2

## 2013-04-19 MED ORDER — IBUPROFEN 600 MG PO TABS
600.0000 mg | ORAL_TABLET | Freq: Four times a day (QID) | ORAL | Status: DC
  Administered 2013-04-19 – 2013-04-21 (×7): 600 mg via ORAL
  Filled 2013-04-19 (×8): qty 1

## 2013-04-19 MED ORDER — FENTANYL 2.5 MCG/ML BUPIVACAINE 1/10 % EPIDURAL INFUSION (WH - ANES)
14.0000 mL/h | INTRAMUSCULAR | Status: DC | PRN
  Filled 2013-04-19: qty 125

## 2013-04-19 MED ORDER — OXYCODONE-ACETAMINOPHEN 5-325 MG PO TABS: 1.0000 | ORAL_TABLET | ORAL | Status: DC | PRN

## 2013-04-19 MED ORDER — DIPHENHYDRAMINE HCL 50 MG/ML IJ SOLN: 12.5000 mg | INTRAMUSCULAR | Status: DC | PRN

## 2013-04-19 MED ORDER — OXYTOCIN 40 UNITS IN LACTATED RINGERS INFUSION - SIMPLE MED
1.0000 m[IU]/min | INTRAVENOUS | Status: DC
Start: 2013-04-19 — End: 2013-04-19
  Administered 2013-04-19: 1 m[IU]/min via INTRAVENOUS

## 2013-04-19 MED ORDER — BENZOCAINE-MENTHOL 20-0.5 % EX AERO
1.0000 "application " | INHALATION_SPRAY | CUTANEOUS | Status: DC | PRN
  Filled 2013-04-19: qty 56

## 2013-04-19 MED ORDER — LANOLIN HYDROUS EX OINT: TOPICAL_OINTMENT | CUTANEOUS | Status: DC | PRN

## 2013-04-19 MED ORDER — TETANUS-DIPHTH-ACELL PERTUSSIS 5-2.5-18.5 LF-MCG/0.5 IM SUSP: 0.5000 mL | Freq: Once | INTRAMUSCULAR | Status: DC

## 2013-04-19 MED ORDER — EPHEDRINE 5 MG/ML INJ: 10.0000 mg | INTRAVENOUS | Status: DC | PRN

## 2013-04-19 MED ORDER — WITCH HAZEL-GLYCERIN EX PADS: 1.0000 "application " | MEDICATED_PAD | CUTANEOUS | Status: DC | PRN

## 2013-04-19 MED ORDER — ONDANSETRON HCL 4 MG/2ML IJ SOLN: 4.0000 mg | INTRAMUSCULAR | Status: DC | PRN

## 2013-04-19 MED ORDER — ZOLPIDEM TARTRATE 5 MG PO TABS: 5.0000 mg | ORAL_TABLET | Freq: Every evening | ORAL | Status: DC | PRN

## 2013-04-19 MED ORDER — FENTANYL 2.5 MCG/ML BUPIVACAINE 1/10 % EPIDURAL INFUSION (WH - ANES)
INTRAMUSCULAR | Status: DC | PRN
  Administered 2013-04-19: 14 mL/h via EPIDURAL

## 2013-04-19 MED ORDER — LIDOCAINE HCL (PF) 1 % IJ SOLN
INTRAMUSCULAR | Status: DC | PRN
  Administered 2013-04-19 (×2): 8 mL

## 2013-04-19 MED ORDER — PHENYLEPHRINE 40 MCG/ML (10ML) SYRINGE FOR IV PUSH (FOR BLOOD PRESSURE SUPPORT)
80.0000 ug | PREFILLED_SYRINGE | INTRAVENOUS | Status: DC | PRN
  Filled 2013-04-19: qty 10

## 2013-04-19 MED ORDER — TERBUTALINE SULFATE 1 MG/ML IJ SOLN: 0.2500 mg | Freq: Once | INTRAMUSCULAR | Status: DC | PRN

## 2013-04-19 NOTE — Anesthesia Postprocedure Evaluation (Signed)
  Anesthesia Post-op Note  Anesthesia Post Note  Patient: Katelyn Zimmerman  Procedure(s) Performed: * No procedures listed *  Anesthesia type: Epidural  Patient location: Mother/Baby  Post pain: Pain level controlled  Post assessment: Post-op Vital signs reviewed  Last Vitals:  Filed Vitals:   04/19/13 1250  BP: 152/83  Pulse: 66  Temp: 36.4 C  Resp: 20    Post vital signs: Reviewed  Level of consciousness:alert  Complications: No apparent anesthesia complications

## 2013-04-19 NOTE — Anesthesia Preprocedure Evaluation (Signed)
Anesthesia Evaluation  Patient identified by MRN, date of birth, ID band Patient awake    Reviewed: Allergy & Precautions, H&P , NPO status , Patient's Chart, lab work & pertinent test results  Airway Mallampati: II TM Distance: >3 FB Neck ROM: full    Dental no notable dental hx.    Pulmonary former smoker,    Pulmonary exam normal       Cardiovascular negative cardio ROS      Neuro/Psych negative neurological ROS  negative psych ROS   GI/Hepatic negative GI ROS, Neg liver ROS,   Endo/Other  negative endocrine ROS  Renal/GU negative Renal ROS     Musculoskeletal   Abdominal Normal abdominal exam  (+)   Peds  Hematology negative hematology ROS (+)   Anesthesia Other Findings   Reproductive/Obstetrics (+) Pregnancy                           Anesthesia Physical Anesthesia Plan  ASA: II  Anesthesia Plan: Epidural   Post-op Pain Management:    Induction:   Airway Management Planned:   Additional Equipment:   Intra-op Plan:   Post-operative Plan:   Informed Consent: I have reviewed the patients History and Physical, chart, labs and discussed the procedure including the risks, benefits and alternatives for the proposed anesthesia with the patient or authorized representative who has indicated his/her understanding and acceptance.     Plan Discussed with:   Anesthesia Plan Comments:         Anesthesia Quick Evaluation

## 2013-04-19 NOTE — Anesthesia Procedure Notes (Signed)
Epidural Patient location during procedure: OB Start time: 04/19/2013 8:58 AM End time: 04/19/2013 9:02 AM  Staffing Anesthesiologist: Lyn Hollingshead Performed by: anesthesiologist   Preanesthetic Checklist Completed: patient identified, surgical consent, pre-op evaluation, timeout performed, IV checked, risks and benefits discussed and monitors and equipment checked  Epidural Patient position: sitting Prep: site prepped and draped and DuraPrep Patient monitoring: continuous pulse ox and blood pressure Approach: midline Injection technique: LOR air  Needle:  Needle type: Tuohy  Needle gauge: 17 G Needle length: 9 cm and 9 Needle insertion depth: 8 cm Catheter type: closed end flexible Catheter size: 19 Gauge Catheter at skin depth: 13 cm Test dose: negative and Other  Assessment Sensory level: T9 Events: blood not aspirated, injection not painful, no injection resistance, negative IV test and paresthesia  Additional Notes L leg X 1Reason for block:procedure for pain

## 2013-04-20 LAB — COMPREHENSIVE METABOLIC PANEL
ALT: 19 U/L (ref 0–35)
AST: 25 U/L (ref 0–37)
Albumin: 2.4 g/dL — ABNORMAL LOW (ref 3.5–5.2)
Alkaline Phosphatase: 156 U/L — ABNORMAL HIGH (ref 39–117)
BILIRUBIN TOTAL: 0.3 mg/dL (ref 0.3–1.2)
BUN: 8 mg/dL (ref 6–23)
CO2: 23 meq/L (ref 19–32)
Calcium: 9.2 mg/dL (ref 8.4–10.5)
Chloride: 102 mEq/L (ref 96–112)
Creatinine, Ser: 0.9 mg/dL (ref 0.50–1.10)
GFR calc Af Amer: >90 mL/min (ref >90)
GFR, EST NON AFRICAN AMERICAN: 81 mL/min — AB (ref >90)
Glucose, Bld: 91 mg/dL (ref 70–99)
Potassium: 3.7 mEq/L (ref 3.7–5.3)
Sodium: 137 mEq/L (ref 137–147)
Total Protein: 6.1 g/dL (ref 6.0–8.3)

## 2013-04-20 LAB — CBC
HEMATOCRIT: 35.4 % — AB (ref 36.0–46.0)
Hemoglobin: 12.1 g/dL (ref 12.0–15.0)
MCH: 29.2 pg (ref 26.0–34.0)
MCHC: 34.2 g/dL (ref 30.0–36.0)
MCV: 85.5 fL (ref 78.0–100.0)
Platelets: 191 10*3/uL (ref 150–400)
RBC: 4.14 MIL/uL (ref 3.87–5.11)
RDW: 15.5 % (ref 11.5–15.5)
WBC: 7 10*3/uL (ref 4.0–10.5)

## 2013-04-20 MED ORDER — LABETALOL HCL 200 MG PO TABS
200.0000 mg | ORAL_TABLET | Freq: Two times a day (BID) | ORAL | Status: DC
  Administered 2013-04-20 – 2013-04-21 (×3): 200 mg via ORAL
  Filled 2013-04-20 (×3): qty 1

## 2013-04-20 NOTE — Progress Notes (Signed)
Post Partum Day 1 Subjective: no complaints, up ad lib, voiding and tolerating PO Pt with slight headache  Objective: Blood pressure 144/87, pulse 89, temperature 98.2 F (36.8 C), temperature source Oral, resp. rate 17, height 5\' 6"  (1.676 m), weight 96.163 kg (212 lb), last menstrual period 07/20/2012, SpO2 96.00%, unknown if currently breastfeeding.  Physical Exam:  General: alert, cooperative and appears stated age Lochia: appropriate Uterine Fundus: firm   Recent Labs  04/19/13 0650 04/20/13 0553  HGB 12.7 12.1  HCT 37.1 35.4*   CMP     Component Value Date/Time   NA 137 04/20/2013 0553   K 3.7 04/20/2013 0553   CL 102 04/20/2013 0553   CO2 23 04/20/2013 0553   GLUCOSE 91 04/20/2013 0553   BUN 8 04/20/2013 0553   CREATININE 0.90 04/20/2013 0553   CALCIUM 9.2 04/20/2013 0553   PROT 6.1 04/20/2013 0553   ALBUMIN 2.4* 04/20/2013 0553   AST 25 04/20/2013 0553   ALT 19 04/20/2013 0553   ALKPHOS 156* 04/20/2013 0553   BILITOT 0.3 04/20/2013 0553   GFRNONAA 81* 04/20/2013 0553   GFRAA >90 04/20/2013 0553    CBC    Component Value Date/Time   WBC 7.0 04/20/2013 0553   RBC 4.14 04/20/2013 0553   HGB 12.1 04/20/2013 0553   HCT 35.4* 04/20/2013 0553   PLT 191 04/20/2013 0553   MCV 85.5 04/20/2013 0553   MCH 29.2 04/20/2013 0553   MCHC 34.2 04/20/2013 0553   RDW 15.5 04/20/2013 0553      Assessment/Plan: Plan for discharge tomorrow BPs still elevated and patient with slight headache. Labs al WNL. Will start po labetalol 200mg  BID   LOS: 2 days   Odean Mcelwain H. 04/20/2013, 9:46 AM

## 2013-04-21 MED ORDER — IBUPROFEN 600 MG PO TABS: 600.0000 mg | ORAL_TABLET | Freq: Four times a day (QID) | ORAL | Status: DC | PRN

## 2013-04-21 MED ORDER — LABETALOL HCL 100 MG PO TABS: 200.0000 mg | ORAL_TABLET | Freq: Two times a day (BID) | ORAL | Status: DC

## 2013-04-21 MED ORDER — HYDROCODONE-ACETAMINOPHEN 5-325 MG PO TABS: ORAL_TABLET | ORAL | Status: DC

## 2013-04-21 NOTE — Discharge Summary (Signed)
Obstetric Discharge Summary Reason for Admission: induction of labor Prenatal Procedures: NST and ultrasound Intrapartum Procedures: spontaneous vaginal delivery Postpartum Procedures: none Complications-Operative and Postpartum: 1st degree perineal laceration Hemoglobin  Date Value Ref Range Status  04/20/2013 12.1  12.0 - 15.0 g/dL Final     HCT  Date Value Ref Range Status  04/20/2013 35.4* 36.0 - 46.0 % Final    Physical Exam:  General: alert, cooperative and appears stated age 38: appropriate Uterine Fundus: firm Incision: healing well DVT Evaluation: No evidence of DVT seen on physical exam.  Discharge Diagnoses: Term Pregnancy-delivered, gestational hypertension  Discharge Information: Date: 04/21/2013 Activity: pelvic rest Diet: routine Medications: Ibuprofen, Vicodin and Labetalol Condition: improved Instructions: refer to practice specific booklet Discharge to: home Follow-up Information   Follow up with Marcial Pacas., MD In 4 weeks. (For a postpartum evaluation)    Specialty:  Obstetrics and Gynecology   Contact information:   [place] [address] [phone]       Newborn Data: Live born female  Birth Weight: 7 lb 3.5 oz (3275 g) APGAR: 8, 9  Home with mother.  Hence Derrick H. 04/21/2013, 10:11 AM

## 2013-04-21 NOTE — Discharge Summary (Deleted)
Obstetric Discharge Summary Reason for Admission: induction of labor Prenatal Procedures: NST and ultrasound Intrapartum Procedures: spontaneous vaginal delivery Postpartum Procedures: none Complications-Operative and Postpartum: 1st degree perineal laceration Hemoglobin  Date Value Ref Range Status  04/20/2013 12.1  12.0 - 15.0 g/dL Final     HCT  Date Value Ref Range Status  04/20/2013 35.4* 36.0 - 46.0 % Final    Physical Exam:  General: alert, cooperative and appears stated age 38: appropriate Uterine Fundus: firm Incision: healing well DVT Evaluation: No evidence of DVT seen on physical exam.  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 (For a postpartum evaluation)    Specialty:  Obstetrics and Gynecology   Contact information:   [address] [place] [address] [phone]       Newborn Data: Live born female  Birth Weight: 7 lb 3.5 oz (3275 g) APGAR: 8, 9  Home with mother.  Katelyn Candy H. 04/21/2013, 10:09 AM

## 2013-05-23 ENCOUNTER — Other Ambulatory Visit: Payer: Self-pay | Admitting: Obstetrics and Gynecology

## 2013-12-02 ENCOUNTER — Encounter (HOSPITAL_COMMUNITY): Payer: Self-pay | Admitting: Anesthesiology

## 2015-01-19 ENCOUNTER — Other Ambulatory Visit: Payer: Self-pay | Admitting: Obstetrics and Gynecology

## 2015-01-20 LAB — CYTOLOGY - PAP

## 2015-09-29 DIAGNOSIS — F411 Generalized anxiety disorder: Secondary | ICD-10-CM | POA: Diagnosis not present

## 2015-09-29 DIAGNOSIS — I10 Essential (primary) hypertension: Secondary | ICD-10-CM | POA: Diagnosis not present

## 2015-09-29 DIAGNOSIS — Z6831 Body mass index (BMI) 31.0-31.9, adult: Secondary | ICD-10-CM | POA: Diagnosis not present

## 2015-09-29 DIAGNOSIS — E669 Obesity, unspecified: Secondary | ICD-10-CM | POA: Diagnosis not present

## 2015-11-05 DIAGNOSIS — R030 Elevated blood-pressure reading, without diagnosis of hypertension: Secondary | ICD-10-CM | POA: Diagnosis not present

## 2015-11-05 DIAGNOSIS — F418 Other specified anxiety disorders: Secondary | ICD-10-CM | POA: Diagnosis not present

## 2015-11-05 DIAGNOSIS — Z6830 Body mass index (BMI) 30.0-30.9, adult: Secondary | ICD-10-CM | POA: Diagnosis not present

## 2015-11-05 DIAGNOSIS — E669 Obesity, unspecified: Secondary | ICD-10-CM | POA: Diagnosis not present

## 2015-12-08 DIAGNOSIS — F329 Major depressive disorder, single episode, unspecified: Secondary | ICD-10-CM | POA: Diagnosis not present

## 2016-01-20 ENCOUNTER — Other Ambulatory Visit: Payer: Self-pay | Admitting: Obstetrics and Gynecology

## 2016-01-20 DIAGNOSIS — Z6832 Body mass index (BMI) 32.0-32.9, adult: Secondary | ICD-10-CM | POA: Diagnosis not present

## 2016-01-20 DIAGNOSIS — Z124 Encounter for screening for malignant neoplasm of cervix: Secondary | ICD-10-CM | POA: Diagnosis not present

## 2016-01-20 DIAGNOSIS — Z130 Encounter for screening for diseases of the blood and blood-forming organs and certain disorders involving the immune mechanism: Secondary | ICD-10-CM | POA: Diagnosis not present

## 2016-01-20 DIAGNOSIS — R319 Hematuria, unspecified: Secondary | ICD-10-CM | POA: Diagnosis not present

## 2016-01-20 DIAGNOSIS — Z1329 Encounter for screening for other suspected endocrine disorder: Secondary | ICD-10-CM | POA: Diagnosis not present

## 2016-01-20 DIAGNOSIS — Z13220 Encounter for screening for lipoid disorders: Secondary | ICD-10-CM | POA: Diagnosis not present

## 2016-01-20 DIAGNOSIS — Z01419 Encounter for gynecological examination (general) (routine) without abnormal findings: Secondary | ICD-10-CM | POA: Diagnosis not present

## 2016-01-21 LAB — CYTOLOGY - PAP

## 2016-02-04 ENCOUNTER — Ambulatory Visit: Payer: Medicaid Other | Admitting: Family Medicine

## 2016-02-04 ENCOUNTER — Ambulatory Visit: Payer: Self-pay | Admitting: Family Medicine

## 2016-03-09 ENCOUNTER — Other Ambulatory Visit: Payer: Self-pay | Admitting: Obstetrics and Gynecology

## 2016-03-09 DIAGNOSIS — Z6832 Body mass index (BMI) 32.0-32.9, adult: Secondary | ICD-10-CM | POA: Diagnosis not present

## 2016-03-09 DIAGNOSIS — Z1231 Encounter for screening mammogram for malignant neoplasm of breast: Secondary | ICD-10-CM | POA: Diagnosis not present

## 2016-03-09 DIAGNOSIS — A630 Anogenital (venereal) warts: Secondary | ICD-10-CM | POA: Diagnosis not present

## 2016-03-10 ENCOUNTER — Encounter: Payer: Self-pay | Admitting: Family Medicine

## 2016-03-10 ENCOUNTER — Ambulatory Visit (INDEPENDENT_AMBULATORY_CARE_PROVIDER_SITE_OTHER): Payer: BLUE CROSS/BLUE SHIELD | Admitting: Family Medicine

## 2016-03-10 VITALS — BP 138/92 | HR 78 | Temp 98.4°F | Ht 66.0 in | Wt 202.4 lb

## 2016-03-10 DIAGNOSIS — F329 Major depressive disorder, single episode, unspecified: Secondary | ICD-10-CM

## 2016-03-10 DIAGNOSIS — R030 Elevated blood-pressure reading, without diagnosis of hypertension: Secondary | ICD-10-CM

## 2016-03-10 DIAGNOSIS — Z7689 Persons encountering health services in other specified circumstances: Secondary | ICD-10-CM

## 2016-03-10 DIAGNOSIS — F32A Depression, unspecified: Secondary | ICD-10-CM

## 2016-03-10 MED ORDER — SERTRALINE HCL 50 MG PO TABS: 50.0000 mg | ORAL_TABLET | Freq: Every day | ORAL | 3 refills | Status: DC

## 2016-03-10 NOTE — Progress Notes (Signed)
Pre visit review using our clinic review tool, if applicable. No additional management support is needed unless otherwise documented below in the visit note. 

## 2016-03-10 NOTE — Patient Instructions (Signed)
It was a pleasure meeting you today!   Please take medication as directed and follow up in one month for evaluation of medication effectiveness, lab work, and physical.  Also, please bring your blood pressure readings with you to this visit for evaluation.  Minimal Blood Pressure Goal= AVERAGE < 140/90; Ideal is an AVERAGE < 135/85. This AVERAGE should be calculated from @ least 5-7 BP readings taken @ different times of day on different days of week. You should not respond to isolated BP readings , but rather the AVERAGE for that week .Please bring your blood pressure cuff to office visits to verify that it is reliable.It can also be checked against the blood pressure device at the pharmacy. Finger or wrist cuffs are not dependable; an arm cuff is.  DASH Eating Plan DASH stands for "Dietary Approaches to Stop Hypertension." The DASH eating plan is a healthy eating plan that has been shown to reduce high blood pressure (hypertension). Additional health benefits may include reducing the risk of type 2 diabetes mellitus, heart disease, and stroke. The DASH eating plan may also help with weight loss. What do I need to know about the DASH eating plan? For the DASH eating plan, you will follow these general guidelines:  Choose foods with less than 150 milligrams of sodium per serving (as listed on the food label).  Use salt-free seasonings or herbs instead of table salt or sea salt.  Check with your health care provider or pharmacist before using salt substitutes.  Eat lower-sodium products. These are often labeled as "low-sodium" or "no salt added."  Eat fresh foods. Avoid eating a lot of canned foods.  Eat more vegetables, fruits, and low-fat dairy products.  Choose whole grains. Look for the word "whole" as the first word in the ingredient list.  Choose fish and skinless chicken or [place] more often than red meat. Limit fish, poultry, and meat to 6 oz (170 g) each day.  Limit sweets,  desserts, sugars, and sugary drinks.  Choose heart-healthy fats.  Eat more home-cooked food and less restaurant, buffet, and fast food.  Limit fried foods.  Do not fry foods. Cook foods using methods such as baking, boiling, grilling, and broiling instead.  When eating at a restaurant, ask that your food be prepared with less salt, or no salt if possible. What foods can I eat? Seek help from a dietitian for individual calorie needs. Grains  Whole grain or whole wheat bread. Brown rice. Whole grain or whole wheat pasta. Quinoa, bulgur, and whole grain cereals. Low-sodium cereals. Corn or whole wheat flour tortillas. Whole grain cornbread. Whole grain crackers. Low-sodium crackers. Vegetables  Fresh or frozen vegetables (raw, steamed, roasted, or grilled). Low-sodium or reduced-sodium tomato and vegetable juices. Low-sodium or reduced-sodium tomato sauce and paste. Low-sodium or reduced-sodium canned vegetables. Fruits  All fresh, canned (in natural juice), or frozen fruits. Meat and Other Protein Products  Ground beef (85% or leaner), grass-fed beef, or beef trimmed of fat. Skinless chicken or [place]. Ground chicken or [place]. Pork trimmed of fat. All fish and seafood. Eggs. Dried beans, peas, or lentils. Unsalted nuts and seeds. Unsalted canned beans. Dairy  Low-fat dairy products, such as skim or 1% milk, 2% or reduced-fat cheeses, low-fat ricotta or cottage cheese, or plain low-fat yogurt. Low-sodium or reduced-sodium cheeses. Fats and Oils  Tub margarines without trans fats. Light or reduced-fat mayonnaise and salad dressings (reduced sodium). Avocado. Safflower, olive, or canola oils. Natural peanut or almond butter. Other  Unsalted  popcorn and pretzels. The items listed above may not be a complete list of recommended foods or beverages. Contact your dietitian for more options.  What foods are not recommended? Grains  White bread. White pasta. White rice. Refined cornbread. Bagels  and croissants. Crackers that contain trans fat. Vegetables  Creamed or fried vegetables. Vegetables in a cheese sauce. Regular canned vegetables. Regular canned tomato sauce and paste. Regular tomato and vegetable juices. Fruits  Canned fruit in light or heavy syrup. Fruit juice. Meat and Other Protein Products  Fatty cuts of meat. Ribs, chicken wings, bacon, sausage, bologna, salami, chitterlings, fatback, hot dogs, bratwurst, and packaged luncheon meats. Salted nuts and seeds. Canned beans with salt. Dairy  Whole or 2% milk, cream, half-and-half, and cream cheese. Whole-fat or sweetened yogurt. Full-fat cheeses or blue cheese. Nondairy creamers and whipped toppings. Processed cheese, cheese spreads, or cheese curds. Condiments  Onion and garlic salt, seasoned salt, table salt, and sea salt. Canned and packaged gravies. Worcestershire sauce. Tartar sauce. Barbecue sauce. Teriyaki sauce. Soy sauce, including reduced sodium. Steak sauce. Fish sauce. Oyster sauce. Cocktail sauce. Horseradish. Ketchup and mustard. Meat flavorings and tenderizers. Bouillon cubes. Hot sauce. Tabasco sauce. Marinades. Taco seasonings. Relishes. Fats and Oils  Butter, stick margarine, lard, shortening, ghee, and bacon fat. Coconut, palm kernel, or palm oils. Regular salad dressings. Other  Pickles and olives. Salted popcorn and pretzels. The items listed above may not be a complete list of foods and beverages to avoid. Contact your dietitian for more information.  Where can I find more information? National Heart, Lung, and Blood Institute: travelstabloid.com This information is not intended to replace advice given to you by your health care provider. Make sure you discuss any questions you have with your health care provider. Document Released: 01/06/2011 Document Revised: 06/25/2015 Document Reviewed: 11/21/2012 Elsevier Interactive Patient Education  2017 [employer].

## 2016-03-10 NOTE — Progress Notes (Signed)
Patient ID: Katelyn Zimmerman, female   DOB: July 19, 1975, 41 y.o.   MRN: [id number]  Patient presents to clinic today to establish care.    Anxiety and depression have been treated previously with Cymbalta.  She reports that this medication caused dizziness and she did not "feel good" so she discontinued this medication. She reports feelings of being overwhelmed with taking care of her 41 year old.  Today, PHQ-9: indicates mild depression.  She is interested in trying another medication today as symptoms are noted as little interest/pleasure in daily activities, lack of concentration, and feelings of sadness. She denies suicidal/homicidal ideation. Reports being happy with her family and work and notes that these symptoms started after having a baby in her "late 55s". She denies pregnancy at this time and is using Nexplanon for contraception.  Prior treatment for HTN during pregnancy.  She reports BP returned to normal and she is not taking any treatment at this time. Today, BP is elevated and retake of BP is 138/92. She denies monitoring this at home and notes that she does not monitor her salt intake or follow a specific diet. She does not exercise.   Health Maintenance: Dental -- She has an upcoming appointment 03/29/16. She has not seen a dentist in over 4 years. Vision -- She has never had this completed before. She plans to make an appointment soon Immunizations -- Influenza UTD. Tdap UTD 2014 Mammogram --03/09/17 PAP -- 01/19/17:  Normal [place].  Nexplanon    Past Medical History:  Diagnosis Date  . Abnormal Pap smear   . Depression   . Fibroid   . Hyperlipidemia   . Hypertension   . UTI (urinary tract infection)     Past Surgical History:  Procedure Laterality Date  . CHOLECYSTECTOMY    . CHOLECYSTECTOMY Bilateral 2001  . NO PAST SURGERIES      Current Outpatient Prescriptions on File Prior to Visit  Medication Sig Dispense Refill  . ibuprofen (ADVIL,MOTRIN) 600  MG tablet Take 1 tablet (600 mg total) by mouth every 6 (six) hours as needed. 90 tablet 0  . labetalol (NORMODYNE) 100 MG tablet Take 2 tablets (200 mg total) by mouth 2 (two) times daily. 60 tablet 1   No current facility-administered medications on file prior to visit.     No Known Allergies  Family History  Problem Relation Age of Onset  . Arthritis Mother   . Hypertension Mother   . Hypertension Father   . Diabetes Father     Social History   Social History  . Marital status: Single    Spouse name: N/A  . Number of children: N/A  . Years of education: N/A   Occupational History  . Not on file.   Social History Main Topics  . Smoking status: Current Every Day Smoker    Last attempt to quit: 08/24/2012  . Smokeless tobacco: Current User  . Alcohol use No     Comment: occasionally  . Drug use: No  . Sexual activity: Yes    Birth control/ protection: IUD   Other Topics Concern  . Not on file   Social History Narrative  . No narrative on file    ROS  Pulse 78   Temp 98.4 F (36.9 C) (Oral)   Ht 5\' 6"  (1.676 m)   Wt 202 lb 6.4 oz (91.8 kg)   LMP 01/15/2016 (Exact Date)   SpO2 98%   BMI 32.67 kg/m   Physical Exam  Recent Results (from the past 2160 hour(s))  Cytology - PAP     Status: None   Collection Time: 01/20/16 12:00 AM  Result Value Ref Range   CYTOLOGY - PAP PAP RESULT     Assessment/Plan: 1. Depression, unspecified depression type I instructed pt to start 1/2 tablet once daily for 1 week and then increase to a full tablet once daily on week two as tolerated.  We discussed common side effects such as nausea, drowsiness and weight gain.  Also discussed rare but serious side effect of suicide ideation.  She is instructed to discontinue medication go directly to ED if this occurs.  Pt verbalizes understanding.  Plan follow up in 1 month to evaluate progress.  Counseling recommended; Patient asked for information which was provided. She plans to  make an appointment for counseling. - sertraline (ZOLOFT) 50 MG tablet; Take 1 tablet (50 mg total) by mouth daily.  Dispense: 30 tablet; Refill: 3  2. Elevated blood pressure reading Retake of BP 138/92. Advised patient to monitor her BP, document readings, and bring readings, and her cuff with her at her physical and follow up in one month. BP parameters provided for patient. Advised DASH diet and provided written recommendations.   3. Encounter to establish care We reviewed the PMH, PSH, FH, SH, Meds and Allergies. -We provided refills for any medications we will prescribe as needed. -We addressed current concerns per orders and patient instructions. -We have asked for records for pertinent exams, studies, vaccines and notes from previous providers. -We have advised patient to follow up per instructions below.   -Follow up in one month for physical and follow up after initiation of sertraline.  Delano Metz, FNP-C

## 2016-04-18 DIAGNOSIS — B370 Candidal stomatitis: Secondary | ICD-10-CM | POA: Diagnosis not present

## 2016-04-18 DIAGNOSIS — J158 Pneumonia due to other specified bacteria: Secondary | ICD-10-CM | POA: Diagnosis not present

## 2016-08-02 ENCOUNTER — Other Ambulatory Visit: Payer: Self-pay | Admitting: Family Medicine

## 2016-08-02 DIAGNOSIS — F329 Major depressive disorder, single episode, unspecified: Secondary | ICD-10-CM

## 2016-08-02 DIAGNOSIS — F32A Depression, unspecified: Secondary | ICD-10-CM

## 2016-08-15 ENCOUNTER — Ambulatory Visit: Payer: BLUE CROSS/BLUE SHIELD | Admitting: Family Medicine

## 2016-08-15 NOTE — Progress Notes (Deleted)
   Subjective:    Patient ID: Katelyn Zimmerman, female    DOB: Dec 12, 1975, 41 y.o.   MRN: [id number]  HPI  Katelyn Zimmerman is a 41 year old female who is here today for evaluation of her chronic conditions. On 03/10/16 she was seen initially to establish care and initiated a trial of sertraline and also was advised to monitor her BP and schedule a follow up in one month.   Subjective: Patient presents for follow-up of depression.  Depressed mood: *** Anhedonia: *** Insomnia: *** Change in Appetite: Guilt: *** Change in Sexual Function: *** Current Medication Regimen: *** Side Effects: ***  Patient endorses ***.  Denies SI/HI. Denies ***.     Hypertension  Patient is currently maintained on the following medications for blood pressure: None at this time;  Monitoring:  Patient denies chest pain, shortness of breath or swelling. Last 3 blood pressure readings in our office are as follows: BP Readings from Last 3 Encounters:  03/10/16 (!) 138/92  04/21/13 (!) 143/84  01/22/13 (!) 129/57         Review of Systems     Objective:   Physical Exam        Assessment & Plan:

## 2016-08-18 ENCOUNTER — Encounter: Payer: Self-pay | Admitting: Family Medicine

## 2016-08-18 ENCOUNTER — Ambulatory Visit (INDEPENDENT_AMBULATORY_CARE_PROVIDER_SITE_OTHER): Payer: BLUE CROSS/BLUE SHIELD | Admitting: Family Medicine

## 2016-08-18 VITALS — BP 120/80 | HR 88 | Temp 98.9°F | Resp 12 | Ht 66.0 in | Wt 207.0 lb

## 2016-08-18 DIAGNOSIS — Z720 Tobacco use: Secondary | ICD-10-CM | POA: Diagnosis not present

## 2016-08-18 DIAGNOSIS — F329 Major depressive disorder, single episode, unspecified: Secondary | ICD-10-CM

## 2016-08-18 DIAGNOSIS — F419 Anxiety disorder, unspecified: Secondary | ICD-10-CM

## 2016-08-18 DIAGNOSIS — F32A Depression, unspecified: Secondary | ICD-10-CM

## 2016-08-18 LAB — BASIC METABOLIC PANEL
BUN: 14 mg/dL (ref 6–23)
CO2: 27 meq/L (ref 19–32)
CREATININE: 0.98 mg/dL (ref 0.40–1.20)
Calcium: 9.7 mg/dL (ref 8.4–10.5)
Chloride: 102 mEq/L (ref 96–112)
GFR: 80.58 mL/min (ref >60.00)
GLUCOSE: 87 mg/dL (ref 70–99)
Potassium: 4.3 mEq/L (ref 3.5–5.1)
Sodium: 136 mEq/L (ref 135–145)

## 2016-08-18 LAB — CBC WITH DIFFERENTIAL/PLATELET
BASOS ABS: 0 10*3/uL (ref 0.0–0.1)
Basophils Relative: 0.5 % (ref 0.0–3.0)
EOS PCT: 1.6 % (ref 0.0–5.0)
Eosinophils Absolute: 0.1 10*3/uL (ref 0.0–0.7)
HEMATOCRIT: 43.8 % (ref 36.0–46.0)
HEMOGLOBIN: 14.4 g/dL (ref 12.0–15.0)
LYMPHS ABS: 1.7 10*3/uL (ref 0.7–4.0)
LYMPHS PCT: 36.2 % (ref 12.0–46.0)
MCHC: 32.7 g/dL (ref 30.0–36.0)
MCV: 88.2 fl (ref 78.0–100.0)
Monocytes Absolute: 0.5 10*3/uL (ref 0.1–1.0)
Monocytes Relative: 9.7 % (ref 3.0–12.0)
Neutro Abs: 2.5 10*3/uL (ref 1.4–7.7)
Neutrophils Relative %: 52 % (ref 43.0–77.0)
Platelets: 253 10*3/uL (ref 150.0–400.0)
RBC: 4.97 Mil/uL (ref 3.87–5.11)
RDW: 14.2 % (ref 11.5–15.5)
WBC: 4.8 10*3/uL (ref 4.0–10.5)

## 2016-08-18 LAB — TSH: TSH: 2.54 u[IU]/mL (ref 0.35–4.50)

## 2016-08-18 MED ORDER — SERTRALINE HCL 100 MG PO TABS: 100.0000 mg | ORAL_TABLET | Freq: Every day | ORAL | 3 refills | Status: DC

## 2016-08-18 NOTE — Progress Notes (Signed)
Subjective:    Patient ID: Katelyn Zimmerman, female    DOB: 12-Mar-1975, 41 y.o.   MRN: [id number]  HPI  Katelyn Zimmerman is a 41 year old female who presents today for follow up on her chronic conditions of anxiety and depression and concern for history of elevated blood pressure.  Anxiety and depression have been improved with sertraline. She reports that while improvement has occurred, she states that she has received approximately 50 % benefit. She reports associated decrease interest/pleasure in daily activities.  She denies suicidal/homicidal ideation.  Reports being happy with her family/work.  She denies pregnancy and she plans to follow up with gynecology for continuation of contraceptive measures. She uses Nexplanon.  She was evaluated on 03/10/16 and sertraline was initiated at that time. PHQ-9 indicated moderate depression. She was advised to follow up in one month for further evaluation after initiation of medication and she did not do so.  She is experiencing changes at work and that has worsened symptoms however still reports receiving "some benefit" from medication. She denies suicidal/homical ideation as noted above. PHQ-9:  7 which indicates mild depression.  History of HTN during pregnancy. She is concerned today as she states that she "feels like BP is high".  She monitors BP infrequently and states that average blood pressure is noted as 154M systolic and 08Q to 76P diastolic.  She denies chest pain, palpitations, SOB, numbness, tingling, weakness, headaches, or edema. She does not follow an exercise program and does not monitor her salt intake or follow a particular diet.  She is a daily smoker and states that anxiety and depression are "causing her to smoke." She has a 5 pack year history and denies any history of tobacco cessation. She also has used smokeless tobacco.  Review of Systems  Constitutional: Negative for chills, fatigue and fever.  Respiratory: Negative  for cough, shortness of breath and wheezing.   Cardiovascular: Negative for chest pain and palpitations.  Gastrointestinal: Negative for abdominal pain, diarrhea, nausea and vomiting.  Musculoskeletal: Negative for myalgias.  Skin: Negative for rash.  Neurological: Negative for dizziness, light-headedness and headaches.  Psychiatric/Behavioral:       Anxiety and depression are noted. No suicidal/homicidal ideation present   Past Medical History:  Diagnosis Date  . Abnormal Pap smear   . Depression   . Fibroid   . Hyperlipidemia   . Hypertension   . UTI (urinary tract infection)      Social History   Social History  . Marital status: Single    Spouse name: N/A  . Number of children: N/A  . Years of education: N/A   Occupational History  . Not on file.   Social History Main Topics  . Smoking status: Current Every Day Smoker    Packs/day: 0.25    Years: 20.00  . Smokeless tobacco: Current User  . Alcohol use 1.2 oz/week    2 Shots of liquor per week     Comment: occasionally  . Drug use: No  . Sexual activity: Yes    Birth control/ protection: IUD   Other Topics Concern  . Not on file   Social History Narrative  . No narrative on file    Past Surgical History:  Procedure Laterality Date  . CHOLECYSTECTOMY    . CHOLECYSTECTOMY Bilateral 2001  . NO PAST SURGERIES      Family History  Problem Relation Age of Onset  . Arthritis Mother   . Hypertension Mother   . Hypertension  Father   . Diabetes Father     No Known Allergies  Current Outpatient Prescriptions on File Prior to Visit  Medication Sig Dispense Refill  . ibuprofen (ADVIL,MOTRIN) 600 MG tablet Take 1 tablet (600 mg total) by mouth every 6 (six) hours as needed. (Patient not taking: Reported on 08/18/2016) 90 tablet 0  . labetalol (NORMODYNE) 100 MG tablet Take 2 tablets (200 mg total) by mouth 2 (two) times daily. (Patient not taking: Reported on 08/18/2016) 60 tablet 1   No current  facility-administered medications on file prior to visit.     BP 120/80 (BP Location: Left Arm, Patient Position: Sitting, Cuff Size: Large)   Pulse 88   Temp 98.9 F (37.2 C) (Oral)   Resp 12   Ht 5\' 6"  (1.676 m)   Wt 207 lb (93.9 kg)   SpO2 98%   BMI 33.41 kg/m       Objective:   Physical Exam  Constitutional: She is oriented to person, place, and time. She appears well-developed and well-nourished.  HENT:  Mouth/Throat: Oropharynx is clear and moist and mucous membranes are normal.  Eyes: Pupils are equal, round, and reactive to light. No scleral icterus.  Neck: Neck supple.  Cardiovascular: Normal rate, regular rhythm and intact distal pulses.   Pulmonary/Chest: Effort normal and breath sounds normal. She has no wheezes. She has no rales.  Abdominal: Soft. Bowel sounds are normal. There is no tenderness.  Musculoskeletal: She exhibits no edema.  Lymphadenopathy:    She has no cervical adenopathy.  Neurological: She is alert and oriented to person, place, and time.  Skin: Skin is warm and dry. No rash noted.  Psychiatric: She has a normal mood and affect. Her behavior is normal. Judgment and thought content normal.      Assessment & Plan:  1. Anxiety and depression Improving; not controlled; will increase sertraline to 100 mg daily and advised patient to add counseling and also follow up in one month for further evaluation of symptoms and dose change. I've explained to her that drugs of the SSRI class can have side effects such as weight gain, sexual dysfunction, insomnia, headache, nausea. These medications are generally effective at alleviating symptoms of anxiety and/or depression. Let me know if significant side effects do occur. She is overdue for CPE and lab work as she has only been seen once to establish care 2/818 and has not followed up as recommended. Will check CBC, BMP, and TSH today and advised patient that a full panel of lab work with physical is  recommended.  - CBC with Differential/Platelet - Basic metabolic panel - TSH  2. Tobacco use Tobacco Cessation- I have discussed the risks of tobacco use with the patient, which includes but are not limited to lung, throat,stomach, pancreatic, and colon cancer,as well as COPD. The type and daily amount of tobacco used, as well as the patients willingness to quit were accessed. I discussed with the patient the different medical interventions, their usefulness, effectiveness and side effects. Pt declines  medical intervention at this time. Will continue to discuss in further visits and patient encouraged to return if they need assistance with cessation in the future.   I spent 25 minutes with this patient and greater than 50% of the time was spent in face to face counseling regarding options to consider in addition to SSRI therapy of behavioral health. We reviewed stress reduction techniques and also advised her to monitor her BP once daily, document readings, and bring readings  with her to follow up visit. Provided BP monitoring parameters and advised we discussed dietary changes to decrease salt in her diet and initiation of exercise to improve her health. She will follow up for CPE and lab work.  Delano Metz, FNP-C

## 2016-08-18 NOTE — Patient Instructions (Signed)
Your medication has been increased to continue improving symptoms of anxiety and depression. It is important that you follow up for further evaluation of this treatment in one month. Also, you can consider counseling in addition to medication as discussed.  Also, please follow up with gynecology as you have planned and advise use of condoms when sexually active.  Minimal Blood Pressure Goal= AVERAGE < 140/90; Ideal is an AVERAGE < 135/85. This AVERAGE should be calculated from @ least 5-7 BP readings taken @ different times of day on different days of week. You should not respond to isolated BP readings , but rather the AVERAGE for that week .Please bring your blood pressure cuff to office visits to verify that it is reliable.It can also be checked against the blood pressure device at the pharmacy. Finger or wrist cuffs are not dependable; an arm cuff is.  We have ordered labs or studies at this visit. It can take up to 1-2 weeks for results and processing. IF results require follow up or explanation, we will call you with instructions. Clinically stable results will be released to your [facility]. If you have not heard from [place] or cannot find your results in [facility] in 2 weeks please contact our office at [phone].  If you are not yet signed up for [facility], please consider signing up   Consider smoking cessation and please make an appointment if you need assistance with stopping smoking.   See information below regarding ways to reduce salt in your diet.  Follow up in one month for physical and lab work as discussed.   DASH Eating Plan DASH stands for "Dietary Approaches to Stop Hypertension." The DASH eating plan is a healthy eating plan that has been shown to reduce high blood pressure (hypertension). It may also reduce your risk for type 2 diabetes, heart disease, and stroke. The DASH eating plan may also help with weight loss. What are tips for following this plan? General  guidelines  Avoid eating more than 2,300 mg (milligrams) of salt (sodium) a day. If you have hypertension, you may need to reduce your sodium intake to 1,500 mg a day.  Limit alcohol intake to no more than 1 drink a day for nonpregnant women and 2 drinks a day for men. One drink equals 12 oz of beer, 5 oz of wine, or 1 oz of hard liquor.  Work with your health care provider to maintain a healthy body weight or to lose weight. Ask what an ideal weight is for you.  Get at least 30 minutes of exercise that causes your heart to beat faster (aerobic exercise) most days of the week. Activities may include walking, swimming, or biking.  Work with your health care provider or diet and nutrition specialist (dietitian) to adjust your eating plan to your individual calorie needs. Reading food labels  Check food labels for the amount of sodium per serving. Choose foods with less than 5 percent of the Daily Value of sodium. Generally, foods with less than 300 mg of sodium per serving fit into this eating plan.  To find whole grains, look for the word "whole" as the first word in the ingredient list. Shopping  Buy products labeled as "low-sodium" or "no salt added."  Buy fresh foods. Avoid canned foods and premade or frozen meals. Cooking  Avoid adding salt when cooking. Use salt-free seasonings or herbs instead of table salt or sea salt. Check with your health care provider or pharmacist before using salt substitutes.  Do not fry foods. Cook foods using healthy methods such as baking, boiling, grilling, and broiling instead.  Cook with heart-healthy oils, such as olive, canola, soybean, or sunflower oil. Meal planning   Eat a balanced diet that includes: ? 5 or more servings of fruits and vegetables each day. At each meal, try to fill half of your plate with fruits and vegetables. ? Up to 6-8 servings of whole grains each day. ? Less than 6 oz of lean meat, poultry, or fish each day. A 3-oz  serving of meat is about the same size as a deck of cards. One egg equals 1 oz. ? 2 servings of low-fat dairy each day. ? A serving of nuts, seeds, or beans 5 times each week. ? Heart-healthy fats. Healthy fats called Omega-3 fatty acids are found in foods such as flaxseeds and coldwater fish, like sardines, salmon, and mackerel.  Limit how much you eat of the following: ? Canned or prepackaged foods. ? Food that is high in trans fat, such as fried foods. ? Food that is high in saturated fat, such as fatty meat. ? Sweets, desserts, sugary drinks, and other foods with added sugar. ? Full-fat dairy products.  Do not salt foods before eating.  Try to eat at least 2 vegetarian meals each week.  Eat more home-cooked food and less restaurant, buffet, and fast food.  When eating at a restaurant, ask that your food be prepared with less salt or no salt, if possible. What foods are recommended? The items listed may not be a complete list. Talk with your dietitian about what dietary choices are best for you. Grains Whole-grain or whole-wheat bread. Whole-grain or whole-wheat pasta. Brown rice. [place]. Bulgur. Whole-grain and low-sodium cereals. Pita bread. Low-fat, low-sodium crackers. Whole-wheat flour tortillas. Vegetables Fresh or frozen vegetables (raw, steamed, roasted, or grilled). Low-sodium or reduced-sodium tomato and vegetable juice. Low-sodium or reduced-sodium tomato sauce and tomato paste. Low-sodium or reduced-sodium canned vegetables. Fruits All fresh, dried, or frozen fruit. Canned fruit in natural juice (without added sugar). Meat and other protein foods Skinless chicken or [place]. Ground chicken or [place]. Pork with fat trimmed off. Fish and seafood. Egg whites. Dried beans, peas, or lentils. Unsalted nuts, nut butters, and seeds. Unsalted canned beans. Lean cuts of beef with fat trimmed off. Low-sodium, lean deli meat. Dairy Low-fat (1%) or fat-free (skim) milk.  Fat-free, low-fat, or reduced-fat cheeses. Nonfat, low-sodium ricotta or cottage cheese. Low-fat or nonfat yogurt. Low-fat, low-sodium cheese. Fats and oils Soft margarine without trans fats. Vegetable oil. Low-fat, reduced-fat, or light mayonnaise and salad dressings (reduced-sodium). Canola, safflower, olive, soybean, and sunflower oils. Avocado. Seasoning and other foods Herbs. Spices. Seasoning mixes without salt. Unsalted popcorn and pretzels. Fat-free sweets. What foods are not recommended? The items listed may not be a complete list. Talk with your dietitian about what dietary choices are best for you. Grains Baked goods made with fat, such as croissants, muffins, or some breads. Dry pasta or rice meal packs. Vegetables Creamed or fried vegetables. Vegetables in a cheese sauce. Regular canned vegetables (not low-sodium or reduced-sodium). Regular canned tomato sauce and paste (not low-sodium or reduced-sodium). Regular tomato and vegetable juice (not low-sodium or reduced-sodium). Angie Fava. Olives. Fruits Canned fruit in a light or heavy syrup. Fried fruit. Fruit in cream or butter sauce. Meat and other protein foods Fatty cuts of meat. Ribs. Fried meat. Berniece Salines. Sausage. Bologna and other processed lunch meats. Salami. Fatback. Hotdogs. Bratwurst. Salted nuts and seeds. Canned beans  with added salt. Canned or smoked fish. Whole eggs or egg yolks. Chicken or [place] with skin. Dairy Whole or 2% milk, cream, and half-and-half. Whole or full-fat cream cheese. Whole-fat or sweetened yogurt. Full-fat cheese. Nondairy creamers. Whipped toppings. Processed cheese and cheese spreads. Fats and oils Butter. Stick margarine. Lard. Shortening. Ghee. Bacon fat. Tropical oils, such as coconut, palm kernel, or palm oil. Seasoning and other foods Salted popcorn and pretzels. Onion salt, garlic salt, seasoned salt, table salt, and sea salt. Worcestershire sauce. Tartar sauce. Barbecue sauce. Teriyaki sauce.  Soy sauce, including reduced-sodium. Steak sauce. Canned and packaged gravies. Fish sauce. Oyster sauce. Cocktail sauce. Horseradish that you find on the shelf. Ketchup. Mustard. Meat flavorings and tenderizers. Bouillon cubes. Hot sauce and Tabasco sauce. Premade or packaged marinades. Premade or packaged taco seasonings. Relishes. Regular salad dressings. Where to find more information:  National Heart, Lung, and [place]: https://wilson-eaton.com/  American Heart Association: www.heart.org Summary  The DASH eating plan is a healthy eating plan that has been shown to reduce high blood pressure (hypertension). It may also reduce your risk for type 2 diabetes, heart disease, and stroke.  With the DASH eating plan, you should limit salt (sodium) intake to 2,300 mg a day. If you have hypertension, you may need to reduce your sodium intake to 1,500 mg a day.  When on the DASH eating plan, aim to eat more fresh fruits and vegetables, whole grains, lean proteins, low-fat dairy, and heart-healthy fats.  Work with your health care provider or diet and nutrition specialist (dietitian) to adjust your eating plan to your individual calorie needs. This information is not intended to replace advice given to you by your health care provider. Make sure you discuss any questions you have with your health care provider. Document Released: 01/06/2011 Document Revised: 01/11/2016 Document Reviewed: 01/11/2016 Elsevier Interactive Patient Education  2017 [employer].

## 2016-09-19 ENCOUNTER — Encounter: Payer: BLUE CROSS/BLUE SHIELD | Admitting: Family Medicine

## 2016-09-30 ENCOUNTER — Ambulatory Visit (INDEPENDENT_AMBULATORY_CARE_PROVIDER_SITE_OTHER): Payer: BLUE CROSS/BLUE SHIELD | Admitting: Family Medicine

## 2016-09-30 ENCOUNTER — Encounter: Payer: Self-pay | Admitting: Family Medicine

## 2016-09-30 VITALS — BP 122/88 | Temp 98.5°F | Ht 65.5 in | Wt 208.0 lb

## 2016-09-30 DIAGNOSIS — Z13220 Encounter for screening for lipoid disorders: Secondary | ICD-10-CM | POA: Diagnosis not present

## 2016-09-30 DIAGNOSIS — E669 Obesity, unspecified: Secondary | ICD-10-CM | POA: Diagnosis not present

## 2016-09-30 DIAGNOSIS — F32A Depression, unspecified: Secondary | ICD-10-CM

## 2016-09-30 DIAGNOSIS — R5383 Other fatigue: Secondary | ICD-10-CM | POA: Diagnosis not present

## 2016-09-30 DIAGNOSIS — Z6834 Body mass index (BMI) 34.0-34.9, adult: Secondary | ICD-10-CM | POA: Diagnosis not present

## 2016-09-30 DIAGNOSIS — F329 Major depressive disorder, single episode, unspecified: Secondary | ICD-10-CM

## 2016-09-30 DIAGNOSIS — Z0001 Encounter for general adult medical examination with abnormal findings: Secondary | ICD-10-CM

## 2016-09-30 DIAGNOSIS — E66811 Obesity, class 1: Secondary | ICD-10-CM

## 2016-09-30 LAB — HEPATIC FUNCTION PANEL
ALBUMIN: 4.1 g/dL (ref 3.5–5.2)
ALT: 16 U/L (ref 0–35)
AST: 18 U/L (ref 0–37)
Alkaline Phosphatase: 76 U/L (ref 39–117)
Bilirubin, Direct: 0.1 mg/dL (ref 0.0–0.3)
Total Bilirubin: 0.4 mg/dL (ref 0.2–1.2)
Total Protein: 7.2 g/dL (ref 6.0–8.3)

## 2016-09-30 LAB — POCT URINALYSIS DIPSTICK
Bilirubin, UA: NEGATIVE
Blood, UA: NEGATIVE
CLARITY UA: NEGATIVE
Glucose, UA: NEGATIVE
Ketones, UA: NEGATIVE
LEUKOCYTES UA: NEGATIVE
NITRITE UA: NEGATIVE
PROTEIN UA: NEGATIVE
Spec Grav, UA: 1.02 (ref 1.010–1.025)
Urobilinogen, UA: 0.2 E.U./dL
pH, UA: 6 (ref 5.0–8.0)

## 2016-09-30 LAB — LIPID PANEL
CHOLESTEROL: 262 mg/dL — AB (ref 0–200)
HDL: 53.9 mg/dL (ref >39.00)
LDL Cholesterol: 180 mg/dL — ABNORMAL HIGH (ref 0–99)
NonHDL: 208.12
Total CHOL/HDL Ratio: 5
Triglycerides: 139 mg/dL (ref 0.0–149.0)
VLDL: 27.8 mg/dL (ref 0.0–40.0)

## 2016-09-30 LAB — HEMOGLOBIN A1C: HEMOGLOBIN A1C: 5.9 % (ref 4.6–6.5)

## 2016-09-30 LAB — VITAMIN D 25 HYDROXY (VIT D DEFICIENCY, FRACTURES): VITD: 20.16 ng/mL — AB (ref 30.00–100.00)

## 2016-09-30 NOTE — Patient Instructions (Signed)
It was a pleasure to see you today! We have ordered labs or studies at this visit. It can take up to 1-2 weeks for results and processing. IF results require follow up or explanation, we will call you with instructions. Clinically stable results will be released to your [facility] : [facility]. If you have not heard from [place] or cannot find your results in [facility] in 2 weeks please contact our office at [phone].  If you are not yet signed up for [facility], please consider signing up   -Please continue sertraline and follow up if symptoms do not continue to improve, worsen, or you develop new symptoms.  -If you would like to consider tobacco cessation, please feel free to follow up for assistance if needed.  -Recommend that you get your flu vaccine this season  My 5 to Fitness!  5: fruits and vegetables per day (work on 9 per day if you are at 5) 4: exercise 4-5 times per week for at least 30 minutes (walking counts!) 3: meals per day (don't skip breakfast!) 2: habits to quit -smoking -excess alcohol use (men >2 beer/day; women >1beer/day) 1: sweet per day (2 cookies, 1 small cup of ice cream, 12 oz soda)  These are general tips for healthy living. Try to start with 1 or 2 habit TODAY and make it a part of your life for several months.  Once you have 1 or 2 habits down for several months, try to begin working on your next healthy habit. With every single step you take, you will be leading a healthier lifestyle!   See health maintenance information below.  Health Maintenance, Female Adopting a healthy lifestyle and getting preventive care can go a long way to promote health and wellness. Talk with your health care provider about what schedule of regular examinations is right for you. This is a good chance for you to check in with your provider about disease prevention and staying healthy. In between checkups, there are plenty of things you can do on your own. Experts have done a lot of research about  which lifestyle changes and preventive measures are most likely to keep you healthy. Ask your health care provider for more information. Weight and diet Eat a healthy diet  Be sure to include plenty of vegetables, fruits, low-fat dairy products, and lean protein.  Do not eat a lot of foods high in solid fats, added sugars, or salt.  Get regular exercise. This is one of the most important things you can do for your health. ? Most adults should exercise for at least 150 minutes each week. The exercise should increase your heart rate and make you sweat (moderate-intensity exercise). ? Most adults should also do strengthening exercises at least twice a week. This is in addition to the moderate-intensity exercise.  Maintain a healthy weight  Body mass index (BMI) is a measurement that can be used to identify possible weight problems. It estimates body fat based on height and weight. Your health care provider can help determine your BMI and help you achieve or maintain a healthy weight.  For females 48 years of age and older: ? A BMI below 18.5 is considered underweight. ? A BMI of 18.5 to 24.9 is normal. ? A BMI of 25 to 29.9 is considered overweight. ? A BMI of 30 and above is considered obese.  Watch levels of cholesterol and blood lipids  You should start having your blood tested for lipids and cholesterol at 41 years of age, then  have this test every 5 years.  You may need to have your cholesterol levels checked more often if: ? Your lipid or cholesterol levels are high. ? You are older than 41 years of age. ? You are at high risk for heart disease.  Cancer screening Lung Cancer  Lung cancer screening is recommended for adults 45-61 years old who are at high risk for lung cancer because of a history of smoking.  A yearly low-dose CT scan of the lungs is recommended for people who: ? Currently smoke. ? Have quit within the past 15 years. ? Have at least a 30-pack-year history of  smoking. A pack year is smoking an average of one pack of cigarettes a day for 1 year.  Yearly screening should continue until it has been 15 years since you quit.  Yearly screening should stop if you develop a health problem that would prevent you from having lung cancer treatment.  Breast Cancer  Practice breast self-awareness. This means understanding how your breasts normally appear and feel.  It also means doing regular breast self-exams. Let your health care provider know about any changes, no matter how small.  If you are in your 20s or 30s, you should have a clinical breast exam (CBE) by a health care provider every 1-3 years as part of a regular health exam.  If you are 41 or older, have a CBE every year. Also consider having a breast X-ray (mammogram) every year.  If you have a family history of breast cancer, talk to your health care provider about genetic screening.  If you are at high risk for breast cancer, talk to your health care provider about having an MRI and a mammogram every year.  Breast cancer gene (BRCA) assessment is recommended for women who have family members with BRCA-related cancers. BRCA-related cancers include: ? Breast. ? Ovarian. ? Tubal. ? Peritoneal cancers.  Results of the assessment will determine the need for genetic counseling and BRCA1 and BRCA2 testing.  Cervical Cancer Your health care provider may recommend that you be screened regularly for cancer of the pelvic organs (ovaries, uterus, and vagina). This screening involves a pelvic examination, including checking for microscopic changes to the surface of your cervix (Pap test). You may be encouraged to have this screening done every 3 years, beginning at age 88.  For women ages 49-65, health care providers may recommend pelvic exams and Pap testing every 3 years, or they may recommend the Pap and pelvic exam, combined with testing for human papilloma virus (HPV), every 5 years. Some types of  HPV increase your risk of cervical cancer. Testing for HPV may also be done on women of any age with unclear Pap test results.  Other health care providers may not recommend any screening for nonpregnant women who are considered low risk for pelvic cancer and who do not have symptoms. Ask your health care provider if a screening pelvic exam is right for you.  If you have had past treatment for cervical cancer or a condition that could lead to cancer, you need Pap tests and screening for cancer for at least 20 years after your treatment. If Pap tests have been discontinued, your risk factors (such as having a new sexual partner) need to be reassessed to determine if screening should resume. Some women have medical problems that increase the chance of getting cervical cancer. In these cases, your health care provider may recommend more frequent screening and Pap tests.  Colorectal Cancer  This type of cancer can be detected and often prevented.  Routine colorectal cancer screening usually begins at 41 years of age and continues through 41 years of age.  Your health care provider may recommend screening at an earlier age if you have risk factors for colon cancer.  Your health care provider may also recommend using home test kits to check for hidden blood in the stool.  A small camera at the end of a tube can be used to examine your colon directly (sigmoidoscopy or colonoscopy). This is done to check for the earliest forms of colorectal cancer.  Routine screening usually begins at age 55.  Direct examination of the colon should be repeated every 5-10 years through 41 years of age. However, you may need to be screened more often if early forms of precancerous polyps or small growths are found.  Skin Cancer  Check your skin from head to toe regularly.  Tell your health care provider about any new moles or changes in moles, especially if there is a change in a mole's shape or color.  Also tell  your health care provider if you have a mole that is larger than the size of a pencil eraser.  Always use sunscreen. Apply sunscreen liberally and repeatedly throughout the day.  Protect yourself by wearing long sleeves, pants, a wide-brimmed hat, and sunglasses whenever you are outside.  Heart disease, diabetes, and high blood pressure  High blood pressure causes heart disease and increases the risk of stroke. High blood pressure is more likely to develop in: ? People who have blood pressure in the high end of the normal range (130-139/85-89 mm Hg). ? People who are overweight or obese. ? People who are African American.  If you are 54-46 years of age, have your blood pressure checked every 3-5 years. If you are 90 years of age or older, have your blood pressure checked every year. You should have your blood pressure measured twice-once when you are at a hospital or clinic, and once when you are not at a hospital or clinic. Record the average of the two measurements. To check your blood pressure when you are not at a hospital or clinic, you can use: ? An automated blood pressure machine at a pharmacy. ? A home blood pressure monitor.  If you are between 76 years and 29 years old, ask your health care provider if you should take aspirin to prevent strokes.  Have regular diabetes screenings. This involves taking a blood sample to check your fasting blood sugar level. ? If you are at a normal weight and have a low risk for diabetes, have this test once every three years after 41 years of age. ? If you are overweight and have a high risk for diabetes, consider being tested at a younger age or more often. Preventing infection Hepatitis B  If you have a higher risk for hepatitis B, you should be screened for this virus. You are considered at high risk for hepatitis B if: ? You were born in a country where hepatitis B is common. Ask your health care provider which countries are considered high  risk. ? Your parents were born in a high-risk country, and you have not been immunized against hepatitis B (hepatitis B vaccine). ? You have HIV or AIDS. ? You use needles to inject street drugs. ? You live with someone who has hepatitis B. ? You have had sex with someone who has hepatitis B. ? You get hemodialysis  treatment. ? You take certain medicines for conditions, including cancer, organ transplantation, and autoimmune conditions.  Hepatitis C  Blood testing is recommended for: ? Everyone born from [birth year] through 1965. ? Anyone with known risk factors for hepatitis C.  Sexually transmitted infections (STIs)  You should be screened for sexually transmitted infections (STIs) including gonorrhea and chlamydia if: ? You are sexually active and are younger than 41 years of age. ? You are older than 41 years of age and your health care provider tells you that you are at risk for this type of infection. ? Your sexual activity has changed since you were last screened and you are at an increased risk for chlamydia or gonorrhea. Ask your health care provider if you are at risk.  If you do not have HIV, but are at risk, it may be recommended that you take a prescription medicine daily to prevent HIV infection. This is called pre-exposure prophylaxis (PrEP). You are considered at risk if: ? You are sexually active and do not regularly use condoms or know the HIV status of your partner(s). ? You take drugs by injection. ? You are sexually active with a partner who has HIV.  Talk with your health care provider about whether you are at high risk of being infected with HIV. If you choose to begin PrEP, you should first be tested for HIV. You should then be tested every 3 months for as long as you are taking PrEP. Pregnancy  If you are premenopausal and you may become pregnant, ask your health care provider about preconception counseling.  If you may become pregnant, take 400 to 800 micrograms  (mcg) of folic acid every day.  If you want to prevent pregnancy, talk to your health care provider about birth control (contraception). Osteoporosis and menopause  Osteoporosis is a disease in which the bones lose minerals and strength with aging. This can result in serious bone fractures. Your risk for osteoporosis can be identified using a bone density scan.  If you are 84 years of age or older, or if you are at risk for osteoporosis and fractures, ask your health care provider if you should be screened.  Ask your health care provider whether you should take a calcium or vitamin D supplement to lower your risk for osteoporosis.  Menopause may have certain physical symptoms and risks.  Hormone replacement therapy may reduce some of these symptoms and risks. Talk to your health care provider about whether hormone replacement therapy is right for you. Follow these instructions at home:  Schedule regular health, dental, and eye exams.  Stay current with your immunizations.  Do not use any tobacco products including cigarettes, chewing tobacco, or electronic cigarettes.  If you are pregnant, do not drink alcohol.  If you are breastfeeding, limit how much and how often you drink alcohol.  Limit alcohol intake to no more than 1 drink per day for nonpregnant women. One drink equals 12 ounces of beer, 5 ounces of wine, or 1 ounces of hard liquor.  Do not use street drugs.  Do not share needles.  Ask your health care provider for help if you need support or information about quitting drugs.  Tell your health care provider if you often feel depressed.  Tell your health care provider if you have ever been abused or do not feel safe at home. This information is not intended to replace advice given to you by your health care provider. Make sure you discuss any questions  you have with your health care provider. Document Released: 08/02/2010 Document Revised: 06/25/2015 Document Reviewed:  10/21/2014 Elsevier Interactive Patient Education  [employer].

## 2016-09-30 NOTE — Progress Notes (Signed)
Subjective:    Patient ID: Katelyn Zimmerman, female    DOB: 1975-03-21, 41 y.o.   MRN: [id number]  HPI  Katelyn Zimmerman is a 41 year old female who is here for routine physical exam today.  She is a daily smoker and reports that she has thought about tobacco cessation but is not interested at this time.   Depression has improved and patient does not attend counseling. Reports that increase in sertraline to 100 mg daily has greatly improved symptoms and she denies adverse effects. Sertraline was increased on 08/18/16 to 100 mg daily. Changes at work that increased symptoms of anxiety and depression however she reports being happy at work. She denies suicidal/homical ideation. Fatigue has been noted by patient; she reports that this is her greatest symptoms. Nothing aggravates or alleviates her fatigue.  Last PHQ-9:   Depression screen [facility] 2/9 08/18/2016 03/10/2016  Decreased Interest 2 1  Down, Depressed, Hopeless 2 1  PHQ - 2 Score 4 2  Altered sleeping 1 1  Tired, decreased energy 1 1  Change in appetite 0 0  Feeling bad or failure about yourself  0 0  Trouble concentrating 1 1  Moving slowly or fidgety/restless 0 0  Suicidal thoughts 0 0  PHQ-9 Score 7 5   PHQ today: Depression screen PHQ 2/9 09/30/2016  Decreased Interest 1  Down, Depressed, Hopeless 1  PHQ - 2 Score 2  Altered sleeping 1  Tired, decreased energy 2  Change in appetite 2  Feeling bad or failure about yourself  0  Trouble concentrating 0  Moving slowly or fidgety/restless 0  Suicidal thoughts 0  PHQ-9 Score 7   BP is WNL today. She reports a history of HTN during pregnancy and she reports monitoring BP infrequently.   She denies chest pain, palpitations, SOB, numbness, tingling, weakness, headaches, or edema. She does not follow an exercise program and does not monitor her salt intake or follow a particular diet. She reports eating "lots of starch" daily including rice daily.  She is interested in  starting a walking program.  She is a daily smoker and has a 5 pack year history.  She denies any history of tobacco cessation. She also has used smokeless tobacco. She is not interested in tobacco cessation at this time.   Influenza vaccine is due and she is going to get this done at work. Tdap is UTD 08/21/2012.  She reports that she is due for dental care and is being treated for periodontitis with cleaning scheduled in 10/18. She is due for vision care. She is followed by women's health for pap and mammogram which is due 01/19/17.     Review of Systems Constitutional: No fever, chills, significant weight change, weakness or night sweats. Fatigue is reported Eyes: No redness, discharge, pain, blurred vision, double vision, or loss of vision ENT/mouth: No nasal congestion, postnasal drainage,epistaxis, purulent discharge, earache, hearing loss, tinnitus ,sore throat , dental pain, or hoarseness   Cardiovascular: no chest pain, palpitations, racing, irregular rhythm, syncope, nausea, sweating, claudication, or edema  Respiratory: No cough, sputum production,hemoptysis,  dyspnea, paroxysmal nocturnal dyspnea, pleuritic chest pain, significant snoring, or  apnea    Gastrointestinal: No heartburn,dysphagia, nausea and vomiting,ominal pain, change in bowels, anorexia, diarrhea, significant constipation, rectal bleeding, melena,  stool incontinence or jaundice Genitourinary: No dysuria,hematuria, pyuria, frequency, urgency,  incontinence, nocturia, dark urine or flank pain Musculoskeletal: No myalgias or muscle cramping, joint stiffness, joint swelling, joint color change, weakness, or cyanosis Dermatologic:  No rash, pruritus, urticaria, or change in color or temperature of skin Neurologic: No headache, vertigo, limb weakness, tremor, gait disturbance, seizures, memory loss, numbness or tingling Psychiatric: No significant anxiety or depression, anhedonia, panic attacks, insomnia, or  anorexia Endocrine: No change in hair/skin/ nails, excessive thirst, excessive hunger, excessive urination, or unexplained fatigue Hematologic/lymphatic: No bruising, lymphadenopathy,or  abnormal clotting Allergy/immunology: No itchy/ watery eyes, abnormal sneezing, rhinitis, urticaria ,or angioedema    Past Medical History:  Diagnosis Date  . Abnormal Pap smear   . Depression   . Fibroid   . Hyperlipidemia   . Hypertension   . UTI (urinary tract infection)      Social History   Social History  . Marital status: Single    Spouse name: N/A  . Number of children: N/A  . Years of education: N/A   Occupational History  . Not on file.   Social History Main Topics  . Smoking status: Current Every Day Smoker    Packs/day: 0.25    Years: 20.00  . Smokeless tobacco: Never Used  . Alcohol use 1.2 oz/week    2 Shots of liquor per week     Comment: occasionally  . Drug use: No  . Sexual activity: Yes    Birth control/ protection: IUD   Other Topics Concern  . Not on file   Social History Narrative  . No narrative on file    Past Surgical History:  Procedure Laterality Date  . CHOLECYSTECTOMY    . CHOLECYSTECTOMY Bilateral 2001  . NO PAST SURGERIES      Family History  Problem Relation Age of Onset  . Arthritis Mother   . Hypertension Mother   . Hypertension Father   . Diabetes Father     No Known Allergies  Current Outpatient Prescriptions on File Prior to Visit  Medication Sig Dispense Refill  . sertraline (ZOLOFT) 100 MG tablet Take 1 tablet (100 mg total) by mouth daily. 30 tablet 3  . ibuprofen (ADVIL,MOTRIN) 600 MG tablet Take 1 tablet (600 mg total) by mouth every 6 (six) hours as needed. (Patient not taking: Reported on 09/30/2016) 90 tablet 0  . labetalol (NORMODYNE) 100 MG tablet Take 2 tablets (200 mg total) by mouth 2 (two) times daily. (Patient not taking: Reported on 08/18/2016) 60 tablet 1   No current facility-administered medications on file prior  to visit.     BP 122/88 (BP Location: Right Arm)   Temp 98.5 F (36.9 C) (Oral)   Ht 5' 5.5" (1.664 m)   Wt 208 lb (94.3 kg)   LMP 08/29/2016 (Within Days)   BMI 34.09 kg/m    Objective:   Physical Exam  Physical Exam  Constitutional: She is oriented to person, place, and time. She appears well-developed and well-nourished. No distress.  HENT:  Head: Normocephalic and atraumatic.  Right Ear: Tympanic membrane and ear canal normal.  Left Ear: Tympanic membrane and ear canal normal.  Mouth/Throat: Oropharynx is clear and moist.  Eyes: Pupils are equal, round, and reactive to light. No scleral icterus.  Neck: Normal range of motion. No thyromegaly present.  Cardiovascular: Normal rate and regular rhythm.   No murmur heard. Pulmonary/Chest: Effort normal and breath sounds normal. No respiratory distress. He has no wheezes. She has no rales. She exhibits no tenderness.  Abdominal: Soft. Bowel sounds are normal. She exhibits no distension and no mass. There is no tenderness. There is no rebound and no guarding.  Musculoskeletal: She exhibits no edema.  Lymphadenopathy:    She has no cervical adenopathy.  Neurological: She is alert and oriented to person, place, and time. She has normal patellar reflexes. She exhibits normal muscle tone. Coordination normal.  Skin: Skin is warm and dry.  Psychiatric: She has a normal mood and affect. Her behavior is normal. Judgment and thought content normal.       Assessment & Plan:  1. Encounter for general adult medical examination with abnormal findings 41 y.o. female presenting for annual physical.  Health Maintenance counseling: 1. Anticipatory guidance: Patient counseled regarding regular dental exams, eye exams, wearing seatbelts.  2. Risk factor reduction:  Advised patient of need for regular exercise and diet rich and fruits and vegetables to reduce risk of heart attack and stroke.  3. Immunizations/screenings/ancillary studies.  She  will obtain influenza vaccine at work.  Immunization History  Administered Date(s) Administered  . Influenza-Unspecified 12/09/2015  . Tdap 08/21/2012   There are no preventive care reminders to display for this patient.  4. Cervical cancer screening- Followed by gynecology 5. Breast cancer screening-  Followed by gynecology 6. Colon cancer screening - Not needed 7. Skin cancer screening- No suspicious lesions noted at this time. Reviewed ABCDEs of skin assessment and use of sunscreen daily  2. Depression, unspecified depression type PHQ-9 remains the same however patient reports feeling better. Improvement with interest and hope are noted. She denies adverse effects. Will continue sertraline for symptom and advised patient to consider counseling in addition as previously discussed. She reports interest in this and notes that she will consider adding this to her therapy.  3. Fatigue, unspecified type Exam and history are reassuring; suspect that this may be related to deconditioning with lack of exercise or focus on improvement of dietary choices. Depression is also a contributing factor. CBC, BMP, and TSH were evaluated one month ago and found to be normal. Will evaluate labs further with A1C as patient is obese and Vitamin D will be assessed.  - POCT urinalysis dipstick - Hemoglobin A1c - VITAMIN D 25 Hydroxy (Vit-D Deficiency, Fractures)  4. Screening for cholesterol level  - Hepatic function panel - Lipid panel   5. Class 1 obesity with body mass index (BMI) of 34.0 to 34.9 in adult, unspecified obesity type, unspecified whether serious comorbidity present Recommended the following healthy lifestyle measures: - eat a healthy whole foods diet consisting of regular small meals composed of vegetables, fruits, beans, nuts, seeds, healthy meats such as white chicken and fish and whole grains.  - avoid sweets, white starchy foods, fried foods, fast food, processed foods, sodas, red meet  and other fattening foods.  - get a least 150-300 minutes of aerobic exercise per week.  Monitor salt intake and monitor blood pressure once weekly. Advised patient to follow up if BP average is noted to be >140/90.  Follow up in one year or sooner if needed.  Delano Metz, FNP-C

## 2016-10-20 ENCOUNTER — Encounter: Payer: Self-pay | Admitting: Family Medicine

## 2017-03-22 ENCOUNTER — Telehealth: Payer: Self-pay

## 2017-03-23 NOTE — Telephone Encounter (Signed)
Fax refill request received from [facility] requesting sertraline, 90 day supply. Called patient and left message to return call. Patient needs to transfer care to another provider, as Delano Metz is no longer a PCP.   Almyra Free, patient is not due for any follow-up until August. Okay to refill sertaline #90 with 1 RF and have patient establish with another provider for further refills?

## 2017-03-23 NOTE — Telephone Encounter (Signed)
Yes, okay to refill.  Thank you.

## 2017-03-24 ENCOUNTER — Encounter: Payer: Self-pay | Admitting: *Deleted

## 2017-03-24 MED ORDER — SERTRALINE HCL 100 MG PO TABS: 100.0000 mg | ORAL_TABLET | Freq: Every day | ORAL | 1 refills | Status: DC

## 2017-03-24 NOTE — Telephone Encounter (Signed)
Medication filled to pharmacy as requested. MyChart message sent to notify patient and instructed her to establish care with another provider prior to next refill.

## 2017-05-25 ENCOUNTER — Ambulatory Visit (INDEPENDENT_AMBULATORY_CARE_PROVIDER_SITE_OTHER): Payer: BLUE CROSS/BLUE SHIELD | Admitting: Family Medicine

## 2017-05-25 ENCOUNTER — Encounter: Payer: Self-pay | Admitting: Family Medicine

## 2017-05-25 VITALS — BP 138/78 | HR 100 | Temp 98.3°F | Ht 65.0 in | Wt 206.9 lb

## 2017-05-25 DIAGNOSIS — F329 Major depressive disorder, single episode, unspecified: Secondary | ICD-10-CM | POA: Diagnosis not present

## 2017-05-25 DIAGNOSIS — J069 Acute upper respiratory infection, unspecified: Secondary | ICD-10-CM | POA: Diagnosis not present

## 2017-05-25 DIAGNOSIS — M545 Low back pain, unspecified: Secondary | ICD-10-CM

## 2017-05-25 DIAGNOSIS — B9789 Other viral agents as the cause of diseases classified elsewhere: Secondary | ICD-10-CM

## 2017-05-25 DIAGNOSIS — F17210 Nicotine dependence, cigarettes, uncomplicated: Secondary | ICD-10-CM

## 2017-05-25 DIAGNOSIS — R631 Polydipsia: Secondary | ICD-10-CM

## 2017-05-25 DIAGNOSIS — F419 Anxiety disorder, unspecified: Secondary | ICD-10-CM

## 2017-05-25 DIAGNOSIS — F32A Depression, unspecified: Secondary | ICD-10-CM

## 2017-05-25 LAB — POC URINALSYSI DIPSTICK (AUTOMATED)
Bilirubin, UA: NEGATIVE
Glucose, UA: NEGATIVE
Ketones, UA: NEGATIVE
Leukocytes, UA: NEGATIVE
Nitrite, UA: NEGATIVE
PH UA: 6.5 (ref 5.0–8.0)
Protein, UA: NEGATIVE
RBC UA: NEGATIVE
Spec Grav, UA: 1.02 (ref 1.010–1.025)
Urobilinogen, UA: 0.2 E.U./dL

## 2017-05-25 LAB — POCT GLYCOSYLATED HEMOGLOBIN (HGB A1C): Hemoglobin A1C: 5.6

## 2017-05-25 MED ORDER — SERTRALINE HCL 25 MG PO TABS: ORAL_TABLET | ORAL | 2 refills | Status: DC

## 2017-05-25 NOTE — Patient Instructions (Addendum)
Continue taking Mucinex as needed for your cough.  You can take Tylenol or ibuprofen for any discomfort.  Back Exercises If you have pain in your back, do these exercises 2-3 times each day or as told by your doctor. When the pain goes away, do the exercises once each day, but repeat the steps more times for each exercise (do more repetitions). If you do not have pain in your back, do these exercises once each day or as told by your doctor. Exercises Single Knee to Chest  Do these steps 3-5 times in a row for each leg: 1. Lie on your back on a firm bed or the floor with your legs stretched out. 2. Bring one knee to your chest. 3. Hold your knee to your chest by grabbing your knee or thigh. 4. Pull on your knee until you feel a gentle stretch in your lower back. 5. Keep doing the stretch for 10-30 seconds. 6. Slowly let go of your leg and straighten it.  Pelvic Tilt  Do these steps 5-10 times in a row: 1. Lie on your back on a firm bed or the floor with your legs stretched out. 2. Bend your knees so they point up to the ceiling. Your feet should be flat on the floor. 3. Tighten your lower belly (abdomen) muscles to press your lower back against the floor. This will make your tailbone point up to the ceiling instead of pointing down to your feet or the floor. 4. Stay in this position for 5-10 seconds while you gently tighten your muscles and breathe evenly.  Cat-Cow  Do these steps until your lower back bends more easily: 1. Get on your hands and knees on a firm surface. Keep your hands under your shoulders, and keep your knees under your hips. You may put padding under your knees. 2. Let your head hang down, and make your tailbone point down to the floor so your lower back is round like the back of a cat. 3. Stay in this position for 5 seconds. 4. Slowly lift your head and make your tailbone point up to the ceiling so your back hangs low (sags) like the back of a cow. 5. Stay in this  position for 5 seconds.  Press-Ups  Do these steps 5-10 times in a row: 1. Lie on your belly (face-down) on the floor. 2. Place your hands near your head, about shoulder-width apart. 3. While you keep your back relaxed and keep your hips on the floor, slowly straighten your arms to raise the top half of your body and lift your shoulders. Do not use your back muscles. To make yourself more comfortable, you may change where you place your hands. 4. Stay in this position for 5 seconds. 5. Slowly return to lying flat on the floor.  Bridges  Do these steps 10 times in a row: 1. Lie on your back on a firm surface. 2. Bend your knees so they point up to the ceiling. Your feet should be flat on the floor. 3. Tighten your butt muscles and lift your butt off of the floor until your waist is almost as high as your knees. If you do not feel the muscles working in your butt and the back of your thighs, slide your feet 1-2 inches farther away from your butt. 4. Stay in this position for 3-5 seconds. 5. Slowly lower your butt to the floor, and let your butt muscles relax.  If this exercise is too easy,  try doing it with your arms crossed over your chest. Belly Crunches  Do these steps 5-10 times in a row: 1. Lie on your back on a firm bed or the floor with your legs stretched out. 2. Bend your knees so they point up to the ceiling. Your feet should be flat on the floor. 3. Cross your arms over your chest. 4. Tip your chin a little bit toward your chest but do not bend your neck. 5. Tighten your belly muscles and slowly raise your chest just enough to lift your shoulder blades a tiny bit off of the floor. 6. Slowly lower your chest and your head to the floor.  Back Lifts Do these steps 5-10 times in a row: 1. Lie on your belly (face-down) with your arms at your sides, and rest your forehead on the floor. 2. Tighten the muscles in your legs and your butt. 3. Slowly lift your chest off of the floor  while you keep your hips on the floor. Keep the back of your head in line with the curve in your back. Look at the floor while you do this. 4. Stay in this position for 3-5 seconds. 5. Slowly lower your chest and your face to the floor.  Contact a doctor if:  Your back pain gets a lot worse when you do an exercise.  Your back pain does not lessen 2 hours after you exercise. If you have any of these problems, stop doing the exercises. Do not do them again unless your doctor says it is okay. Get help right away if:  You have sudden, very bad back pain. If this happens, stop doing the exercises. Do not do them again unless your doctor says it is okay. This information is not intended to replace advice given to you by your health care provider. Make sure you discuss any questions you have with your health care provider. Document Released: 02/19/2010 Document Revised: 06/25/2015 Document Reviewed: 03/13/2014 Elsevier Interactive Patient Education  2018 [place] Injury Prevention Back injuries can be very painful. They can also be difficult to heal. After having one back injury, you are more likely to injure your back again. It is important to learn how to avoid injuring or re-injuring your back. The following tips can help you to prevent a back injury. What should I know about physical fitness?  Exercise for 30 minutes per day on most days of the week or as told by your doctor. Make sure to: ? Do aerobic exercises, such as walking, jogging, biking, or swimming. ? Do exercises that increase balance and strength, such as tai chi and yoga. ? Do stretching exercises. This helps with flexibility. ? Try to develop strong belly (abdominal) muscles. Your belly muscles help to support your back.  Stay at a healthy weight. This helps to decrease your risk of a back injury. What should I know about my diet?  Talk with your doctor about your overall diet. Take supplements and vitamins only  as told by your doctor.  Talk with your doctor about how much calcium and vitamin D you need each day. These nutrients help to prevent weakening of the bones (osteoporosis).  Include good sources of calcium in your diet, such as: ? Dairy products. ? Green leafy vegetables. ? Products that have had calcium added to them (fortified).  Include good sources of vitamin D in your diet, such as: ? Milk. ? Foods that have had vitamin D added to them. What  should I know about my posture?  Sit up straight and stand up straight. Avoid leaning forward when you sit or hunching over when you stand.  Choose chairs that have good low-back (lumbar) support.  If you work at a desk, sit close to it so you do not need to lean over. Keep your chin tucked in. Keep your neck drawn back. Keep your elbows bent so your arms look like the letter "L" (right angle).  Sit high and close to the steering wheel when you drive. Add a low-back support to your car seat, if needed.  Avoid sitting or standing in one position for very long. Take breaks to get up, stretch, and walk around at least one time every hour. Take breaks every hour if you are driving for long periods of time.  Sleep on your side with your knees slightly bent, or sleep on your back with a pillow under your knees. Do not lie on the front of your body to sleep. What should I know about lifting, twisting, and reaching? Lifting and Heavy Lifting   Avoid heavy lifting, especially lifting over and over again. If you must do heavy lifting: ? Stretch before lifting. ? Work slowly. ? Rest between lifts. ? Use a tool such as a cart or a dolly to move objects if one is available. ? Make several small trips instead of carrying one heavy load. ? Ask for help when you need it, especially when moving big objects.  Follow these steps when lifting: ? Stand with your feet shoulder-width apart. ? Get as close to the object as you can. Do not pick up a heavy  object that is far from your body. ? Use handles or lifting straps if they are available. ? Bend at your knees. Squat down, but keep your heels off the floor. ? Keep your shoulders back. Keep your chin tucked in. Keep your back straight. ? Lift the object slowly while you tighten the muscles in your legs, belly, and butt. Keep the object as close to the center of your body as possible.  Follow these steps when putting down a heavy load: ? Stand with your feet shoulder-width apart. ? Lower the object slowly while you tighten the muscles in your legs, belly, and butt. Keep the object as close to the center of your body as possible. ? Keep your shoulders back. Keep your chin tucked in. Keep your back straight. ? Bend at your knees. Squat down, but keep your heels off the floor. ? Use handles or lifting straps if they are available. Twisting and Reaching  Avoid lifting heavy objects above your waist.  Do not twist at your waist while you are lifting or carrying a load. If you need to turn, move your feet.  Do not bend over without bending at your knees.  Avoid reaching over your head, across a table, or for an object on a high surface. What are some other tips?  Avoid wet floors and icy ground. Keep sidewalks clear of ice to prevent falls.  Do not sleep on a mattress that is too soft or too hard.  Keep items that you use often within easy reach.  Put heavier objects on shelves at waist level, and put lighter objects on lower or higher shelves.  Find ways to lower your stress, such as: ? Exercise. ? Massage. ? Relaxation techniques.  Talk with your doctor if you feel anxious or depressed. These conditions can make back pain worse.  Wear flat heel shoes with cushioned soles.  Avoid making quick (sudden) movements.  Use both shoulder straps when carrying a backpack.  Do not use any tobacco products, including cigarettes, chewing tobacco, or electronic cigarettes. If you need help  quitting, ask your doctor. This information is not intended to replace advice given to you by your health care provider. Make sure you discuss any questions you have with your health care provider. Document Released: 07/06/2007 Document Revised: 06/25/2015 Document Reviewed: 01/21/2014 Elsevier Interactive Patient Education  2018 [place] with Quitting Smoking Quitting smoking is a physical and mental challenge. You will face cravings, withdrawal symptoms, and temptation. Before quitting, work with your health care provider to make a plan that can help you cope. Preparation can help you quit and keep you from giving in. How can I cope with cravings? Cravings usually last for 5-10 minutes. If you get through it, the craving will pass. Consider taking the following actions to help you cope with cravings:  Keep your mouth busy: ? Chew sugar-free gum. ? Suck on hard candies or a straw. ? Brush your teeth.  Keep your hands and body busy: ? Immediately change to a different activity when you feel a craving. ? Squeeze or play with a ball. ? Do an activity or a hobby, like making bead jewelry, practicing needlepoint, or working with wood. ? Mix up your normal routine. ? Take a short exercise break. Go for a quick walk or run up and down stairs. ? Spend time in public places where smoking is not allowed.  Focus on doing something kind or helpful for someone else.  Call a friend or family member to talk during a craving.  Join a support group.  Call a quit line, such as 1-800-QUIT-NOW.  Talk with your health care provider about medicines that might help you cope with cravings and make quitting easier for you.  How can I deal with withdrawal symptoms? Your body may experience negative effects as it tries to get used to not having nicotine in the system. These effects are called withdrawal symptoms. They may include:  Feeling hungrier than normal.  Trouble  concentrating.  Irritability.  Trouble sleeping.  Feeling depressed.  Restlessness and agitation.  Craving a cigarette.  To manage withdrawal symptoms:  Avoid places, people, and activities that trigger your cravings.  Remember why you want to quit.  Get plenty of sleep.  Avoid coffee and other caffeinated drinks. These may worsen some of your symptoms.  How can I handle social situations? Social situations can be difficult when you are quitting smoking, especially in the first few weeks. To manage this, you can:  Avoid parties, bars, and other social situations where people might be smoking.  Avoid alcohol.  Leave right away if you have the urge to smoke.  Explain to your family and friends that you are quitting smoking. Ask for understanding and support.  Plan activities with friends or family where smoking is not an option.  What are some ways I can cope with stress? Wanting to smoke may cause stress, and stress can make you want to smoke. Find ways to manage your stress. Relaxation techniques can help. For example:  Breathe slowly and deeply, in through your nose and out through your mouth.  Listen to soothing, relaxing music.  Talk with a family member or friend about your stress.  Light a candle.  Soak in a bath or take a shower.  Think about a peaceful  place.  What are some ways I can prevent weight gain? Be aware that many people gain weight after they quit smoking. However, not everyone does. To keep from gaining weight, have a plan in place before you quit and stick to the plan after you quit. Your plan should include:  Having healthy snacks. When you have a craving, it may help to: ? Eat plain popcorn, crunchy carrots, celery, or other cut vegetables. ? Chew sugar-free gum.  Changing how you eat: ? Eat small portion sizes at meals. ? Eat 4-6 small meals throughout the day instead of 1-2 large meals a day. ? Be mindful when you eat. Do not watch  television or do other things that might distract you as you eat.  Exercising regularly: ? Make time to exercise each day. If you do not have time for a long workout, do short bouts of exercise for 5-10 minutes several times a day. ? Do some form of strengthening exercise, like weight lifting, and some form of aerobic exercise, like running or swimming.  Drinking plenty of water or other low-calorie or no-calorie drinks. Drink 6-8 glasses of water daily, or as much as instructed by your health care provider.  Summary  Quitting smoking is a physical and mental challenge. You will face cravings, withdrawal symptoms, and temptation to smoke again. Preparation can help you as you go through these challenges.  You can cope with cravings by keeping your mouth busy (such as by chewing gum), keeping your body and hands busy, and making calls to family, friends, or a helpline for people who want to quit smoking.  You can cope with withdrawal symptoms by avoiding places where people smoke, avoiding drinks with caffeine, and getting plenty of rest.  Ask your health care provider about the different ways to prevent weight gain, avoid stress, and handle social situations. This information is not intended to replace advice given to you by your health care provider. Make sure you discuss any questions you have with your health care provider. Document Released: 01/15/2016 Document Revised: 01/15/2016 Document Reviewed: 01/15/2016 Elsevier Interactive Patient Education  2018 [employer].  Viral Respiratory Infection A viral respiratory infection is an illness that affects parts of the body used for breathing, like the lungs, nose, and throat. It is caused by a germ called a virus. Some examples of this kind of infection are:  A cold.  The flu (influenza).  A respiratory syncytial virus (RSV) infection.  How do I know if I have this infection? Most of the time this infection causes:  A stuffy or  runny nose.  Yellow or green fluid in the nose.  A cough.  Sneezing.  Tiredness (fatigue).  Achy muscles.  A sore throat.  Sweating or chills.  A fever.  A headache.  How is this infection treated? If the flu is diagnosed early, it may be treated with an antiviral medicine. This medicine shortens the length of time a person has symptoms. Symptoms may be treated with over-the-counter and prescription medicines, such as:  Expectorants. These make it easier to cough up mucus.  Decongestant nasal sprays.  Doctors do not prescribe antibiotic medicines for viral infections. They do not work with this kind of infection. How do I know if I should stay home? To keep others from getting sick, stay home if you have:  A fever.  A lasting cough.  A sore throat.  A runny nose.  Sneezing.  Muscles aches.  Headaches.  Tiredness.  Weakness.  Chills.  Sweating.  An upset stomach (nausea).  Follow these instructions at home:  Rest as much as possible.  Take over-the-counter and prescription medicines only as told by your doctor.  Drink enough fluid to keep your pee (urine) clear or pale yellow.  Gargle with salt water. Do this 3-4 times per day or as needed. To make a salt-water mixture, dissolve -1 tsp of salt in 1 cup of warm water. Make sure the salt dissolves all the way.  Use nose drops made from salt water. This helps with stuffiness (congestion). It also helps soften the skin around your nose.  Do not drink alcohol.  Do not use tobacco products, including cigarettes, chewing tobacco, and e-cigarettes. If you need help quitting, ask your doctor. Get help if:  Your symptoms last for 10 days or longer.  Your symptoms get worse over time.  You have a fever.  You have very bad pain in your face or forehead.  Parts of your jaw or neck become very swollen. Get help right away if:  You feel pain or pressure in your chest.  You have shortness of  breath.  You faint or feel like you will faint.  You keep throwing up (vomiting).  You feel confused. This information is not intended to replace advice given to you by your health care provider. Make sure you discuss any questions you have with your health care provider. Document Released: 12/31/2007 Document Revised: 06/25/2015 Document Reviewed: 06/25/2014 Elsevier Interactive Patient Education  2018 [employer].

## 2017-05-25 NOTE — Progress Notes (Signed)
Subjective:    Patient ID: Katelyn Zimmerman, female    DOB: [birth year], 42 y.o.   MRN: [id number]  No chief complaint on file.   HPI Patient was seen today for transfer of care and acute concerns.  Patient was previously seen by Delano Metz, FNP.  Pt endorses productive cough with yellow sputum, chills, sore throat, rhinorrhea since Sunday.  Pt has taken Mucinex and Nyquil for her symptoms.    Pt also endorses low back pain.  Pt endorses the pain initially starting in the bilateral low back times several weeks.  Pt does not recall any injury or heavy lifting to cause the pain.  Pt states on Sunday her pain returned but was mainly on the left side.  Pt states the muscle spasms were so bad she called out of work.  Pt is not taking for her back pain. Pt denies increased urination, dysuria, hematuria.  Pt does endorse increased thirst.  Pt endorses increased anxiety.  Pt was on Zoloft 100 mg daily.  Pt states she tried to call the clinic for refills on Zoloft however by the time the refills were authorized she had gone through "withdrawals.  Pt states she felt bad coming off of the medicine, pt endorses flulike symptoms.  Pt states she would like to restart the medication.  Pt has not been to counseling.    Past Medical History:  Diagnosis Date  . Abnormal Pap smear   . Depression   . Fibroid   . Hyperlipidemia   . Hypertension   . UTI (urinary tract infection)     No Known Allergies  ROS General: Denies fever, night sweats, changes in weight, changes in appetite  + increased thirst, chills HEENT: Denies headaches, ear pain, changes in vision   + rhinorrhea, sore throat   CV: Denies CP, palpitations, SOB, orthopnea Pulm: Denies SOB, cough, wheezing GI: Denies abdominal pain, nausea, vomiting, diarrhea, constipation GU: Denies dysuria, hematuria, frequency, vaginal discharge   Msk: Denies muscle cramps, joint pains  +L low back pain Neuro: Denies weakness, numbness,  tingling Skin: Denies rashes, bruising Psych: Denies depression, hallucinations  + anxiety      Objective:    Blood pressure 138/78, pulse 100, temperature 98.3 F (36.8 C), temperature source Oral, height 5\' 5"  (1.651 m), weight 206 lb 14.4 oz (93.8 kg), SpO2 96 %, unknown if currently breastfeeding.   Gen. Pleasant, well-nourished, in no distress, normal affect   HEENT: [place]/AT, face symmetric, no scleral icterus, PERRLA, nares patent without drainage, pharynx without erythema or exudate.  TMs full bilaterally.  No cervical lymphadenopathy. Lungs: no accessory muscle use, CTAB, no wheezes or rales Cardiovascular: RRR, no m/r/g, no peripheral edema Abdomen: BS present, soft, NT/ND Musculoskeletal: No CVA tenderness.no deformities, no cyanosis or clubbing, normal tone.  No TTP of spine.  Normal range of motion of spine.  Normal strength of upper and lower extremities bilaterally.    Wt Readings from Last 3 Encounters:  05/25/17 206 lb 14.4 oz (93.8 kg)  09/30/16 208 lb (94.3 kg)  08/18/16 207 lb (93.9 kg)    Lab Results  Component Value Date   WBC 4.8 08/18/2016   HGB 14.4 08/18/2016   HCT 43.8 08/18/2016   PLT 253.0 08/18/2016   GLUCOSE 87 08/18/2016   CHOL 262 (H) 09/30/2016   TRIG 139.0 09/30/2016   HDL 53.90 09/30/2016   LDLCALC 180 (H) 09/30/2016   ALT 16 09/30/2016   AST 18 09/30/2016   NA 136 08/18/2016  K 4.3 08/18/2016   CL 102 08/18/2016   CREATININE 0.98 08/18/2016   BUN 14 08/18/2016   CO2 27 08/18/2016   TSH 2.54 08/18/2016   HGBA1C 5.9 09/30/2016    Assessment/Plan:  Viral URI with cough -Supportive care -Continue Mucinex.  Okay to gargle with water Chloraseptic Spray. -Can use Tylenol if needed for any pain/discomfort  Acute left-sided low back pain without sciatica -Discussed use event, heat, massage  Increased thirst  -UA with SG 1.020 -Hgb A1C 5.6% - Plan: POCT Urinalysis Dipstick (Automated), POCT glycosylated hemoglobin (Hb  A1C)  Cigarette nicotine dependence without complication -Smoking cessation greater than 3 minutes, less than 10 minutes -Patient smoking 5 cigarettes/day -Patient encouraged to cut back on the number of cigarettes smoked per day -Patient contemplating this idea -Also advised there are medications, lozenges, patches or gum to help patient quit -Given handout -We will reassess at each visit  Anxiety and depression -PHQ 9 score 12 -Had 7 score 18 -We will restart Zoloft at 25 mg daily.  Pt to increase to 50 mg daily after 1 week. -Patient encouraged to consider counseling given increased anxiety.  Follow-up PRN.  F/u in 1-2 months for anxiety  Grier Mitts, MD

## 2017-06-06 ENCOUNTER — Other Ambulatory Visit: Payer: Self-pay | Admitting: *Deleted

## 2017-06-06 DIAGNOSIS — F419 Anxiety disorder, unspecified: Principal | ICD-10-CM

## 2017-06-06 DIAGNOSIS — F329 Major depressive disorder, single episode, unspecified: Secondary | ICD-10-CM

## 2017-06-06 DIAGNOSIS — F32A Depression, unspecified: Secondary | ICD-10-CM

## 2017-06-06 MED ORDER — SERTRALINE HCL 25 MG PO TABS: ORAL_TABLET | ORAL | 2 refills | Status: DC

## 2017-06-06 NOTE — Telephone Encounter (Signed)
Clarification request received from pharmacy.  Script for sertraline was sent w/ instructions to take 20 mg daily x1 week instead of 25 mg. Should be 25 mg per last note.  New script filled to pharmacy with updated instructions as requested.

## 2017-06-15 DIAGNOSIS — L700 Acne vulgaris: Secondary | ICD-10-CM | POA: Diagnosis not present

## 2017-08-09 ENCOUNTER — Other Ambulatory Visit: Payer: Self-pay | Admitting: Family Medicine

## 2017-08-09 DIAGNOSIS — F32A Depression, unspecified: Secondary | ICD-10-CM

## 2017-08-09 DIAGNOSIS — F329 Major depressive disorder, single episode, unspecified: Secondary | ICD-10-CM

## 2017-08-11 ENCOUNTER — Telehealth: Payer: Self-pay | Admitting: Family Medicine

## 2017-08-11 DIAGNOSIS — F32A Depression, unspecified: Secondary | ICD-10-CM

## 2017-08-11 DIAGNOSIS — F419 Anxiety disorder, unspecified: Principal | ICD-10-CM

## 2017-08-11 DIAGNOSIS — F329 Major depressive disorder, single episode, unspecified: Secondary | ICD-10-CM

## 2017-08-11 MED ORDER — SERTRALINE HCL 25 MG PO TABS: ORAL_TABLET | ORAL | 0 refills | Status: DC

## 2017-08-11 NOTE — Telephone Encounter (Signed)
Copied from [place] [id number]. Topic: Quick Communication - Rx Refill/Question >> Aug 11, 2017  9:37 AM Celedonio Savage L wrote: Medication: sertraline (ZOLOFT) 25 MG tablet   pt would like a call when its been done  Has the patient contacted their pharmacy? Yes.  Wednesday (Agent: If no, request that the patient contact the pharmacy for the refill.) (Agent: If yes, when and what did the pharmacy advise?) they was waiting on a prior authorization since Wednesday  Preferred Pharmacy (with phone number or street name): [id number] [address], [place] [phone] (Phone) [phone] (Fax)      Agent: Please be advised that RX refills may take up to 3 business days. We ask that you follow-up with your pharmacy.

## 2017-08-11 NOTE — Telephone Encounter (Signed)
Rx done. 

## 2017-10-17 ENCOUNTER — Other Ambulatory Visit: Payer: Self-pay | Admitting: Family Medicine

## 2017-11-08 ENCOUNTER — Ambulatory Visit: Payer: BLUE CROSS/BLUE SHIELD | Admitting: Family Medicine

## 2017-11-08 DIAGNOSIS — Z0289 Encounter for other administrative examinations: Secondary | ICD-10-CM

## 2017-12-14 ENCOUNTER — Encounter: Payer: Self-pay | Admitting: Family Medicine

## 2017-12-14 ENCOUNTER — Ambulatory Visit (INDEPENDENT_AMBULATORY_CARE_PROVIDER_SITE_OTHER): Payer: BLUE CROSS/BLUE SHIELD | Admitting: Family Medicine

## 2017-12-14 VITALS — BP 140/96 | HR 80 | Temp 98.4°F | Wt 209.0 lb

## 2017-12-14 DIAGNOSIS — F32A Depression, unspecified: Secondary | ICD-10-CM

## 2017-12-14 DIAGNOSIS — F419 Anxiety disorder, unspecified: Secondary | ICD-10-CM

## 2017-12-14 DIAGNOSIS — F329 Major depressive disorder, single episode, unspecified: Secondary | ICD-10-CM

## 2017-12-14 MED ORDER — SERTRALINE HCL 25 MG PO TABS: ORAL_TABLET | ORAL | 4 refills | Status: DC

## 2017-12-14 NOTE — Progress Notes (Signed)
Subjective:    Patient ID: Katelyn Zimmerman, female    DOB: [birth year], 42 y.o.   MRN: [id number]  No chief complaint on file.   HPI Patient was seen today for ongoing concern.  Pt notes ongoing anxiety.  On zoloft, but states could not get refills.  Off  medicine x a few months.  Notes difficulty leaving tasks unfinished at work and home.  Goes to work early and stays late as she is worried about getting things done.  Pt endorses obsessing over task, cleaning at home, mind racing at night.  Pt does not want her behavior to affect her child.  Past Medical History:  Diagnosis Date  . Abnormal Pap smear   . Depression   . Fibroid   . Hyperlipidemia   . Hypertension   . UTI (urinary tract infection)     No Known Allergies  ROS General: Denies fever, chills, night sweats, changes in weight, changes in appetite HEENT: Denies headaches, ear pain, changes in vision, rhinorrhea, sore throat CV: Denies CP, palpitations, SOB, orthopnea Pulm: Denies SOB, cough, wheezing GI: Denies abdominal pain, nausea, vomiting, diarrhea, constipation GU: Denies dysuria, hematuria, frequency, vaginal discharge Msk: Denies muscle cramps, joint pains Neuro: Denies weakness, numbness, tingling Skin: Denies rashes, bruising Psych: Denies depression, hallucinations  +anxiety     Objective:    Blood pressure (!) 140/96, pulse 80, temperature 98.4 F (36.9 C), temperature source Oral, weight 209 lb (94.8 kg), SpO2 98 %, unknown if currently breastfeeding.   Gen. Pleasant, well-nourished, in no distress, normal affect, tearful, frustrated HEENT: [place]/AT, face symmetric, no scleral icterus, PERRLA, nares patent without drainage Lungs: no accessory muscle use Cardiovascular: RRR, no peripheral edema Neuro:  A&Ox3, CN II-XII intact, normal gait Skin:  Warm, no lesions/ rash  Wt Readings from Last 3 Encounters:  05/25/17 206 lb 14.4 oz (93.8 kg)  09/30/16 208 lb (94.3 kg)  08/18/16 207 lb (93.9 kg)     Lab Results  Component Value Date   WBC 4.8 08/18/2016   HGB 14.4 08/18/2016   HCT 43.8 08/18/2016   PLT 253.0 08/18/2016   GLUCOSE 87 08/18/2016   CHOL 262 (H) 09/30/2016   TRIG 139.0 09/30/2016   HDL 53.90 09/30/2016   LDLCALC 180 (H) 09/30/2016   ALT 16 09/30/2016   AST 18 09/30/2016   NA 136 08/18/2016   K 4.3 08/18/2016   CL 102 08/18/2016   CREATININE 0.98 08/18/2016   BUN 14 08/18/2016   CO2 27 08/18/2016   TSH 2.54 08/18/2016   HGBA1C 5.6 05/25/2017    Assessment/Plan:  Anxiety and depression  -obtain PHQ9 and GAD 7 this visit -will restart zoloft.  Can start with 25 mg daily x 1 wk then increase to 50 mg daily. - Plan: sertraline (ZOLOFT) 25 MG tablet F/u in 6 wks, sooner if needed  Grier Mitts, MD

## 2017-12-14 NOTE — Patient Instructions (Signed)
Generalized Anxiety Disorder, Adult Generalized anxiety disorder (GAD) is a mental health disorder. People with this condition constantly worry about everyday events. Unlike normal anxiety, worry related to GAD is not triggered by a specific event. These worries also do not fade or get better with time. GAD interferes with life functions, including relationships, work, and school. GAD can vary from mild to severe. People with severe GAD can have intense waves of anxiety with physical symptoms (panic attacks). What are the causes? The exact cause of GAD is not known. What increases the risk? This condition is more likely to develop in:  Women.  People who have a family history of anxiety disorders.  People who are very shy.  People who experience very stressful life events, such as the death of a loved one.  People who have a very stressful family environment.  What are the signs or symptoms? People with GAD often worry excessively about many things in their lives, such as their health and family. They may also be overly concerned about:  Doing well at work.  Being on time.  Natural disasters.  Friendships.  Physical symptoms of GAD include:  Fatigue.  Muscle tension or having muscle twitches.  Trembling or feeling shaky.  Being easily startled.  Feeling like your heart is pounding or racing.  Feeling out of breath or like you cannot take a deep breath.  Having trouble falling asleep or staying asleep.  Sweating.  Nausea, diarrhea, or irritable bowel syndrome (IBS).  Headaches.  Trouble concentrating or remembering facts.  Restlessness.  Irritability.  How is this diagnosed? Your health care provider can diagnose GAD based on your symptoms and medical history. You will also have a physical exam. The health care provider will ask specific questions about your symptoms, including how severe they are, when they started, and if they come and go. Your health care  provider may ask you about your use of alcohol or drugs, including prescription medicines. Your health care provider may refer you to a mental health specialist for further evaluation. Your health care provider will do a thorough examination and may perform additional tests to rule out other possible causes of your symptoms. To be diagnosed with GAD, a person must have anxiety that:  Is out of his or her control.  Affects several different aspects of his or her life, such as work and relationships.  Causes distress that makes him or her unable to take part in normal activities.  Includes at least three physical symptoms of GAD, such as restlessness, fatigue, trouble concentrating, irritability, muscle tension, or sleep problems.  Before your health care provider can confirm a diagnosis of GAD, these symptoms must be present more days than they are not, and they must last for six months or longer. How is this treated? The following therapies are usually used to treat GAD:  Medicine. Antidepressant medicine is usually prescribed for long-term daily control. Antianxiety medicines may be added in severe cases, especially when panic attacks occur.  Talk therapy (psychotherapy). Certain types of talk therapy can be helpful in treating GAD by providing support, education, and guidance. Options include: ? Cognitive behavioral therapy (CBT). People learn coping skills and techniques to ease their anxiety. They learn to identify unrealistic or negative thoughts and behaviors and to replace them with positive ones. ? Acceptance and commitment therapy (ACT). This treatment teaches people how to be mindful as a way to cope with unwanted thoughts and feelings. ? Biofeedback. This process trains you to   manage your body's response (physiological response) through breathing techniques and relaxation methods. You will work with a therapist while machines are used to monitor your physical symptoms.  Stress  management techniques. These include yoga, meditation, and exercise.  A mental health specialist can help determine which treatment is best for you. Some people see improvement with one type of therapy. However, other people require a combination of therapies. Follow these instructions at home:  Take over-the-counter and prescription medicines only as told by your health care provider.  Try to maintain a normal routine.  Try to anticipate stressful situations and allow extra time to manage them.  Practice any stress management or self-calming techniques as taught by your health care provider.  Do not punish yourself for setbacks or for not making progress.  Try to recognize your accomplishments, even if they are small.  Keep all follow-up visits as told by your health care provider. This is important. Contact a health care provider if:  Your symptoms do not get better.  Your symptoms get worse.  You have signs of depression, such as: ? A persistently sad, cranky, or irritable mood. ? Loss of enjoyment in activities that used to bring you joy. ? Change in weight or eating. ? Changes in sleeping habits. ? Avoiding friends or family members. ? Loss of energy for normal tasks. ? Feelings of guilt or worthlessness. Get help right away if:  You have serious thoughts about hurting yourself or others. If you ever feel like you may hurt yourself or others, or have thoughts about taking your own life, get help right away. You can go to your nearest emergency department or call:  Your local emergency services (911 in the [place]  A suicide crisis helpline, such as the [facility] at 619-413-6159. This is open 24 hours a day.  Summary  Generalized anxiety disorder (GAD) is a mental health disorder that involves worry that is not triggered by a specific event.  People with GAD often worry excessively about many things in their lives, such as their health and  family.  GAD may cause physical symptoms such as restlessness, trouble concentrating, sleep problems, frequent sweating, nausea, diarrhea, headaches, and trembling or muscle twitching.  A mental health specialist can help determine which treatment is best for you. Some people see improvement with one type of therapy. However, other people require a combination of therapies. This information is not intended to replace advice given to you by your health care provider. Make sure you discuss any questions you have with your health care provider. Document Released: 05/14/2012 Document Revised: 12/08/2015 Document Reviewed: 12/08/2015 Elsevier Interactive Patient Education  2018 [place] With Obsessive-Compulsive Disorder If you have been diagnosed with obsessive-compulsive disorder (OCD), you may be relieved that you now know why you have felt or behaved a certain way. You may also feel overwhelmed about the treatment ahead, how to get the support you need, and how to deal with the condition day-to-day. With treatment and support, you can manage your OCD. How to manage lifestyle changes Managing stress Stress is your body's reaction to life changes and events, both good and bad. Stress can play a major role in OCD, so it is important to learn how to cope with stress. Some techniques to cope with stress include:  Meditation, muscle relaxation, and breathing exercises.  Exercise. Even a short daily walk can help to lower stress levels.  Getting enough good-quality sleep.  Spending time on hobbies that  you enjoy.  Accepting and letting go of things that you cannot change.  To deal with stress associated with OCD, your health care provider may recommend exposure and response prevention therapy. In this therapy, you will be exposed to the distressing situation that triggers your compulsion and be prevented from responding to it. With repetition of this process over time, you will no  longer feel the distress or need to perform the compulsion. Medicines Your health care provider may suggest certain antidepressant medicines if he or she feels that they will help to improve your condition. Avoid using alcohol and other substances that may prevent your medicines from working properly (may interact). It is also important to:  Talk with your pharmacist or health care provider about all medicines that you take, their possible side effects, and which medicines are safe to take together.  Make it your goal to take part in all treatment decisions (shared decision-making). Ask about possible side effects of medicines that your health care provider recommends, and tell him or her how you feel about having those side effects. It is best if shared decision-making with your health care provider is part of your total treatment plan.  If you are taking medicines as part of your treatment, do not stop taking medicines before you ask your health care provider if it is safe to stop. You may need to have the medicine slowly decreased (tapered) over time to lower the risk of harmful side effects. Relationships Your family and friends may need to learn about your OCD in order to cope with your condition and support you. Consider giving education materials to friends and family. Family therapy may also help to lower stress and relieve tension. How to recognize changes in your condition Some signs that your condition may be getting worse include:  Being anxious about germs or dirt.  Having harmful thoughts about yourself or others.  Making sure that household objects are alike or perfectly organized in a specific way.  Having great difficulty making decisions, or second-guessing yourself after making a decision.  Constant cleaning and handwashing.  Repeating behavior such as repeatedly checking to see if a door is locked or the oven is off.  Counting nonstop or uncontrollably.  Where to find  support Talking with others It may be difficult to tell loved ones about your condition, but they can be a good support system for you. You can work with your therapist to decide whom to tell and when to tell them. Here are some tips for starting the conversation:  Start by sharing your experience with OCD. It is up to you how much detail you want to provide.  Let your loved ones know that you are seeking treatment.  Do not expect loved ones to understand your condition right away.  Finances Not all insurance plans cover mental health care, so it is important to check with your insurance carrier. If paying for co-pays or counseling services is a problem, search for a local or county mental health care center. Public mental health care services may be offered there at a low cost or no cost when you are not able to see a private health care provider. If you are taking medicine for depression, you may be able to get the generic form, which may be less expensive than brand-name medicine. Some makers of prescription medicines also offer help to patients who cannot afford the medicines they need. Follow these instructions at home:  Check with your health care  provider before starting any new prescription or over-the-counter medicines.  Ask for support from trusted family members or friends to make sure you stay on-track with your treatment.  Keep all follow-up visits as told by your health care provider and therapist. This is important.  Keep a journal to write down your daily moods, medicines, sleep habits, and life events. Doing this may help you have more success with your treatment.  Maintain a healthy lifestyle. Eat a healthy diet, exercise regularly, get plenty of sleep, and take time to relax. Questions to ask your health care provider  If you are taking medicines: ? How long do I need to take medicine? ? Are there any long-term side effects of my medicine? ? Are there any alternatives  to taking medicine?  How would I benefit from therapy?  How often should I follow up with a health care provider? Where to find more information:  International OCD Foundation: Administrator.iocdf.[place] [employer] Mental [employer]ness (NAMI): SeekSigns.dk Contact a health care provider if:  Your symptoms get worse or they do not get better with treatment.  You develop new symptoms. Get help right away if:  You have severe side effects after taking your medicine.  You have thoughts about hurting yourself or others. If you ever feel like you may hurt yourself or others, or have thoughts about taking your own life, get help right away. You can go to your nearest emergency department or call:  Your local emergency services (911 in the [place]  A suicide crisis helpline, such as the [facility] at (228) 200-7673. This is open 24 hours a day.  Summary  Stress can play a major role in obsessive-compulsive disorder (OCD). Learning ways to deal with stress may help your treatment work better for you.  If you are taking medicines as part of your treatment, do not stop taking medicines before you ask your health care provider if it is safe to stop.  When talking with family members and friends about your OCD, decide how much detail you want to give them and be patient as they work to understand your condition.  Keep all follow-up visits as told by your health care provider and therapist. This is important. This information is not intended to replace advice given to you by your health care provider. Make sure you discuss any questions you have with your health care provider. Document Released: 05/19/2016 Document Revised: 05/19/2016 Document Reviewed: 05/19/2016 Elsevier Interactive Patient Education  [employer].

## 2017-12-18 ENCOUNTER — Encounter: Payer: Self-pay | Admitting: Family Medicine

## 2017-12-21 ENCOUNTER — Telehealth: Payer: Self-pay

## 2017-12-21 NOTE — Telephone Encounter (Signed)
Pt states her rx for Sertraline is incorrect. She should be taking 50mg  QD. Her insurance is not covering it the way it is written on the most recent rx. Please correct and resend to [facility].

## 2017-12-25 NOTE — Telephone Encounter (Signed)
Pt states that the rx was sent back in the same way as before and her insurance will not cover. It needs to be wrote for 1 tablet per day and for the pill to be 50mg . He insurance will only cover the medicine this way.

## 2017-12-26 ENCOUNTER — Other Ambulatory Visit: Payer: Self-pay

## 2017-12-26 DIAGNOSIS — F419 Anxiety disorder, unspecified: Principal | ICD-10-CM

## 2017-12-26 DIAGNOSIS — F32A Depression, unspecified: Secondary | ICD-10-CM

## 2017-12-26 DIAGNOSIS — F329 Major depressive disorder, single episode, unspecified: Secondary | ICD-10-CM

## 2017-12-26 MED ORDER — SERTRALINE HCL 50 MG PO TABS: ORAL_TABLET | ORAL | 0 refills | Status: DC

## 2017-12-26 NOTE — Telephone Encounter (Signed)
Please advise if ok to sent a new Rx

## 2017-12-26 NOTE — Telephone Encounter (Signed)
Ok to send in sertraline 50 mg tabs.

## 2017-12-26 NOTE — Telephone Encounter (Signed)
Rx sent to pt pharmacy 

## 2018-01-12 ENCOUNTER — Ambulatory Visit: Payer: BLUE CROSS/BLUE SHIELD | Admitting: Family Medicine

## 2018-02-19 ENCOUNTER — Encounter: Payer: Self-pay | Admitting: Family Medicine

## 2018-04-04 ENCOUNTER — Encounter: Payer: Self-pay | Admitting: Family Medicine

## 2018-06-09 ENCOUNTER — Other Ambulatory Visit: Payer: Self-pay | Admitting: Family Medicine

## 2018-06-09 DIAGNOSIS — F329 Major depressive disorder, single episode, unspecified: Secondary | ICD-10-CM

## 2018-06-09 DIAGNOSIS — F32A Depression, unspecified: Secondary | ICD-10-CM

## 2018-06-09 DIAGNOSIS — F419 Anxiety disorder, unspecified: Secondary | ICD-10-CM

## 2018-08-06 ENCOUNTER — Other Ambulatory Visit: Payer: Self-pay | Admitting: Family Medicine

## 2018-08-06 DIAGNOSIS — F419 Anxiety disorder, unspecified: Secondary | ICD-10-CM

## 2018-08-06 DIAGNOSIS — F329 Major depressive disorder, single episode, unspecified: Secondary | ICD-10-CM

## 2018-08-06 DIAGNOSIS — F32A Depression, unspecified: Secondary | ICD-10-CM

## 2019-01-02 ENCOUNTER — Encounter: Payer: Commercial Managed Care - PPO | Admitting: Family Medicine

## 2019-01-15 ENCOUNTER — Other Ambulatory Visit: Payer: Self-pay | Admitting: Family Medicine

## 2019-01-15 DIAGNOSIS — F32A Depression, unspecified: Secondary | ICD-10-CM

## 2019-01-15 DIAGNOSIS — F419 Anxiety disorder, unspecified: Secondary | ICD-10-CM

## 2019-01-15 DIAGNOSIS — F329 Major depressive disorder, single episode, unspecified: Secondary | ICD-10-CM

## 2019-01-16 ENCOUNTER — Telehealth (INDEPENDENT_AMBULATORY_CARE_PROVIDER_SITE_OTHER): Payer: Commercial Managed Care - PPO | Admitting: Family Medicine

## 2019-01-16 DIAGNOSIS — F419 Anxiety disorder, unspecified: Secondary | ICD-10-CM | POA: Diagnosis not present

## 2019-01-16 DIAGNOSIS — F329 Major depressive disorder, single episode, unspecified: Secondary | ICD-10-CM | POA: Diagnosis not present

## 2019-01-16 DIAGNOSIS — F32A Depression, unspecified: Secondary | ICD-10-CM

## 2019-01-16 MED ORDER — SERTRALINE HCL 50 MG PO TABS: 50.0000 mg | ORAL_TABLET | Freq: Every day | ORAL | 1 refills | Status: DC

## 2019-01-16 NOTE — Progress Notes (Signed)
Virtual Visit via Video Note  I connected with Katelyn Zimmerman on 01/16/19 at  1:30 PM EST by a video enabled telemedicine application 2/2 [id number] pandemic and verified that I am speaking with the correct person using two identifiers.  Location patient: home Location provider:work or home office Persons participating in the virtual visit: patient, provider  I discussed the limitations of evaluation and management by telemedicine and the availability of in person appointments. The patient expressed understanding and agreed to proceed.  HPI: Pt requesting refill on Zoloft 50 mg.  Was trying to avoid withdrawal symptoms by taking 1/2 a tab to make med last until could be seen.  Mood was fine until ran out of Zoloft 2 wks ago.  States people are noticing the change in her mood.  Pt having difficulty sleeping.  Had to take tylenol pm last night to help calm down.  Prior to running out of med pt was doing fine.  Was told by pharmacy her insurance will not pay for Zoloft unless it is a 90 day supply.  Also states the pharmacy had an issue with how the rx was written.    ROS: See pertinent positives and negatives per HPI.  Past Medical History:  Diagnosis Date  . Abnormal Pap smear   . Depression   . Fibroid   . Hyperlipidemia   . Hypertension   . UTI (urinary tract infection)     Past Surgical History:  Procedure Laterality Date  . CHOLECYSTECTOMY    . CHOLECYSTECTOMY Bilateral 2001  . NO PAST SURGERIES      Family History  Problem Relation Age of Onset  . Arthritis Mother   . Hypertension Mother   . Hypertension Father   . Diabetes Father     Current Outpatient Medications:  .  sertraline (ZOLOFT) 50 MG tablet, TAKE 1 TABLET BY MOUTH EVERY DAY, Disp: 90 tablet, Rfl: 1  EXAM:  VITALS per patient if applicable:  RR between 12-20 bpm  GENERAL: alert, oriented, appears well and in no acute distress  HEENT: atraumatic, conjunctiva clear, no obvious abnormalities on inspection  of external nose and ears  NECK: normal movements of the head and neck  LUNGS: on inspection no signs of respiratory distress, breathing rate appears normal, no obvious gross SOB, gasping or wheezing  CV: no obvious cyanosis  MS: moves all visible extremities without noticeable abnormality  PSYCH/NEURO: pleasant and cooperative, no obvious depression or anxiety, speech and thought processing grossly intact  ASSESSMENT AND PLAN:  Discussed the following assessment and plan:  Anxiety and depression  -elevated since running out of medication -will restart Zoloft 50 mg - Plan: sertraline (ZOLOFT) 50 MG tablet  F/u in the next few months prn   I discussed the assessment and treatment plan with the patient. The patient was provided an opportunity to ask questions and all were answered. The patient agreed with the plan and demonstrated an understanding of the instructions.   The patient was advised to call back or seek an in-person evaluation if the symptoms worsen or if the condition fails to improve as anticipated.   [name], MD   This note is not being shared with the patient for the following reason: To prevent harm (release of this note would result in harm to the life or physical safety of the patient or another).

## 2019-06-10 ENCOUNTER — Telehealth: Payer: Self-pay | Admitting: Family Medicine

## 2019-06-10 NOTE — Telephone Encounter (Signed)
Pt scheduled for MyChart video visit with Dr Volanda Napoleon on 06/11/2019 at 2 pm for med refill

## 2019-06-10 NOTE — Telephone Encounter (Signed)
Pt call and need a refill on sertraline (ZOLOFT) 50 MG tablet [facility]  [id number] [address] Phone:  [phone]  Fax:  [phone]

## 2019-06-11 ENCOUNTER — Telehealth (INDEPENDENT_AMBULATORY_CARE_PROVIDER_SITE_OTHER): Payer: Commercial Managed Care - PPO | Admitting: Family Medicine

## 2019-06-11 ENCOUNTER — Encounter: Payer: Self-pay | Admitting: Family Medicine

## 2019-06-11 DIAGNOSIS — F32A Depression, unspecified: Secondary | ICD-10-CM

## 2019-06-11 DIAGNOSIS — F419 Anxiety disorder, unspecified: Secondary | ICD-10-CM

## 2019-06-11 DIAGNOSIS — F329 Major depressive disorder, single episode, unspecified: Secondary | ICD-10-CM

## 2019-06-11 MED ORDER — SERTRALINE HCL 50 MG PO TABS: 50.0000 mg | ORAL_TABLET | Freq: Every day | ORAL | 1 refills | Status: DC

## 2019-06-11 NOTE — Progress Notes (Signed)
Virtual Visit via Video Note  I connected with Katelyn Zimmerman on 06/11/19 at  2:00 PM EDT by a video enabled telemedicine application 2/2 [id number] pandemic and verified that I am speaking with the correct person using two identifiers.  Location patient: work [employer] or home office Persons participating in the virtual visit: patient, provider  I discussed the limitations of evaluation and management by telemedicine and the availability of in person appointments. The patient expressed understanding and agreed to proceed.   HPI: Pt is a 44 yo female with pmh sig for HLD, depression, fibroids, HTN who was seen for f/u.  Pt states she has been doing well for the most part.  Pt did not want to run out of Zoloft to avoid withdrawal symptoms.  Pt states mood and sleep are ok.  Energy could be better.  ROS: See pertinent positives and negatives per HPI.  Past Medical History:  Diagnosis Date  . Abnormal Pap smear   . Depression   . Fibroid   . Hyperlipidemia   . Hypertension   . UTI (urinary tract infection)     Past Surgical History:  Procedure Laterality Date  . CHOLECYSTECTOMY    . CHOLECYSTECTOMY Bilateral 2001  . NO PAST SURGERIES      Family History  Problem Relation Age of Onset  . Arthritis Mother   . Hypertension Mother   . Hypertension Father   . Diabetes Father     Current Outpatient Medications:  .  sertraline (ZOLOFT) 50 MG tablet, Take 1 tablet (50 mg total) by mouth daily., Disp: 90 tablet, Rfl: 1  EXAM:  VITALS per patient if applicable:  RR between 12-20 bpm  GENERAL: alert, oriented, appears well and in no acute distress  HEENT: atraumatic, conjunctiva clear, no obvious abnormalities on inspection of external nose and ears  NECK: normal movements of the head and neck  LUNGS: on inspection no signs of respiratory distress, breathing rate appears normal, no obvious gross SOB, gasping or wheezing  CV: no obvious cyanosis  MS: moves all  visible extremities without noticeable abnormality  PSYCH/NEURO: pleasant and cooperative, no obvious depression or anxiety, speech and thought processing grossly intact  ASSESSMENT AND PLAN:  Discussed the following assessment and plan:  Anxiety and depression  -stable -continue current meds - Plan: sertraline (ZOLOFT) 50 MG tablet  Pt has CPE scheduled next month.   I discussed the assessment and treatment plan with the patient. The patient was provided an opportunity to ask questions and all were answered. The patient agreed with the plan and demonstrated an understanding of the instructions.   The patient was advised to call back or seek an in-person evaluation if the symptoms worsen or if the condition fails to improve as anticipated.   [name], MD

## 2019-07-09 ENCOUNTER — Other Ambulatory Visit: Payer: Self-pay

## 2019-07-10 ENCOUNTER — Encounter: Payer: Self-pay | Admitting: Family Medicine

## 2019-07-10 ENCOUNTER — Ambulatory Visit (INDEPENDENT_AMBULATORY_CARE_PROVIDER_SITE_OTHER): Payer: Commercial Managed Care - PPO | Admitting: Family Medicine

## 2019-07-10 ENCOUNTER — Other Ambulatory Visit (HOSPITAL_COMMUNITY)
Admission: RE | Admit: 2019-07-10 | Discharge: 2019-07-10 | Disposition: A | Discharge status: Home / Self Care | Payer: Commercial Managed Care - PPO | Source: Ambulatory Visit | Attending: Family Medicine | Admitting: Family Medicine

## 2019-07-10 VITALS — BP 130/84 | HR 68 | Temp 98.1°F | Ht 66.0 in | Wt 208.8 lb

## 2019-07-10 DIAGNOSIS — Z124 Encounter for screening for malignant neoplasm of cervix: Secondary | ICD-10-CM

## 2019-07-10 DIAGNOSIS — Z0000 Encounter for general adult medical examination without abnormal findings: Secondary | ICD-10-CM | POA: Diagnosis not present

## 2019-07-10 DIAGNOSIS — F32A Depression, unspecified: Secondary | ICD-10-CM

## 2019-07-10 DIAGNOSIS — F419 Anxiety disorder, unspecified: Secondary | ICD-10-CM

## 2019-07-10 DIAGNOSIS — Z13220 Encounter for screening for lipoid disorders: Secondary | ICD-10-CM

## 2019-07-10 DIAGNOSIS — Z113 Encounter for screening for infections with a predominantly sexual mode of transmission: Secondary | ICD-10-CM

## 2019-07-10 DIAGNOSIS — E6609 Other obesity due to excess calories: Secondary | ICD-10-CM

## 2019-07-10 DIAGNOSIS — F329 Major depressive disorder, single episode, unspecified: Secondary | ICD-10-CM

## 2019-07-10 NOTE — Patient Instructions (Signed)
Preventive Care 40-44 Years Old, Female Preventive care refers to visits with your health care provider and lifestyle choices that can promote health and wellness. This includes:  A yearly physical exam. This may also be called an annual well check.  Regular dental visits and eye exams.  Immunizations.  Screening for certain conditions.  Healthy lifestyle choices, such as eating a healthy diet, getting regular exercise, not using drugs or products that contain nicotine and tobacco, and limiting alcohol use. What can I expect for my preventive care visit? Physical exam Your health care provider will check your:  Height and weight. This may be used to calculate body mass index (BMI), which tells if you are at a healthy weight.  Heart rate and blood pressure.  Skin for abnormal spots. Counseling Your health care provider may ask you questions about your:  Alcohol, tobacco, and drug use.  Emotional well-being.  Home and relationship well-being.  Sexual activity.  Eating habits.  Work and work environment.  Method of birth control.  Menstrual cycle.  Pregnancy history. What immunizations do I need?  Influenza (flu) vaccine  This is recommended every year. Tetanus, diphtheria, and pertussis (Tdap) vaccine  You may need a Td booster every 10 years. Varicella (chickenpox) vaccine  You may need this if you have not been vaccinated. Zoster (shingles) vaccine  You may need this after age 60. Measles, mumps, and rubella (MMR) vaccine  You may need at least one dose of MMR if you were born in 1957 or later. You may also need a second dose. Pneumococcal conjugate (PCV13) vaccine  You may need this if you have certain conditions and were not previously vaccinated. Pneumococcal polysaccharide (PPSV23) vaccine  You may need one or two doses if you smoke cigarettes or if you have certain conditions. Meningococcal conjugate (MenACWY) vaccine  You may need this if you  have certain conditions. Hepatitis A vaccine  You may need this if you have certain conditions or if you travel or work in places where you may be exposed to hepatitis A. Hepatitis B vaccine  You may need this if you have certain conditions or if you travel or work in places where you may be exposed to hepatitis B. Haemophilus influenzae type b (Hib) vaccine  You may need this if you have certain conditions. Human papillomavirus (HPV) vaccine  If recommended by your health care provider, you may need three doses over 6 months. You may receive vaccines as individual doses or as more than one vaccine together in one shot (combination vaccines). Talk with your health care provider about the risks and benefits of combination vaccines. What tests do I need? Blood tests  Lipid and cholesterol levels. These may be checked every 5 years, or more frequently if you are over 50 years old.  Hepatitis C test.  Hepatitis B test. Screening  Lung cancer screening. You may have this screening every year starting at age 55 if you have a 30-pack-year history of smoking and currently smoke or have quit within the past 15 years.  Colorectal cancer screening. All adults should have this screening starting at age 50 and continuing until age 75. Your health care provider may recommend screening at age 45 if you are at increased risk. You will have tests every 1-10 years, depending on your results and the type of screening test.  Diabetes screening. This is done by checking your blood sugar (glucose) after you have not eaten for a while (fasting). You may have this   done every 1-3 years.  Mammogram. This may be done every 1-2 years. Talk with your health care provider about when you should start having regular mammograms. This may depend on whether you have a family history of breast cancer.  BRCA-related cancer screening. This may be done if you have a family history of breast, ovarian, tubal, or peritoneal  cancers.  Pelvic exam and Pap test. This may be done every 3 years starting at age 21. Starting at age 30, this may be done every 5 years if you have a Pap test in combination with an HPV test. Other tests  Sexually transmitted disease (STD) testing.  Bone density scan. This is done to screen for osteoporosis. You may have this scan if you are at high risk for osteoporosis. Follow these instructions at home: Eating and drinking  Eat a diet that includes fresh fruits and vegetables, whole grains, lean protein, and low-fat dairy.  Take vitamin and mineral supplements as recommended by your health care provider.  Do not drink alcohol if: ? Your health care provider tells you not to drink. ? You are pregnant, may be pregnant, or are planning to become pregnant.  If you drink alcohol: ? Limit how much you have to 0-1 drink a day. ? Be aware of how much alcohol is in your drink. In the [place] one drink equals one 12 oz bottle of beer (355 mL), one 5 oz glass of wine (148 mL), or one 1 oz glass of hard liquor (44 mL). Lifestyle  Take daily care of your teeth and gums.  Stay active. Exercise for at least 30 minutes on 5 or more days each week.  Do not use any products that contain nicotine or tobacco, such as cigarettes, e-cigarettes, and chewing tobacco. If you need help quitting, ask your health care provider.  If you are sexually active, practice safe sex. Use a condom or other form of birth control (contraception) in order to prevent pregnancy and STIs (sexually transmitted infections).  If told by your health care provider, take low-dose aspirin daily starting at age 50. What's next?  Visit your health care provider once a year for a well check visit.  Ask your health care provider how often you should have your eyes and teeth checked.  Stay up to date on all vaccines. This information is not intended to replace advice given to you by your health care provider. Make sure you  discuss any questions you have with your health care provider. Document Revised: 09/28/2017 Document Reviewed: 09/28/2017 Elsevier Patient Education  2020 [employer]  Managing Anxiety, Adult After being diagnosed with an anxiety disorder, you may be relieved to know why you have felt or behaved a certain way. You may also feel overwhelmed about the treatment ahead and what it will mean for your life. With care and support, you can manage this condition and recover from it. How to manage lifestyle changes Managing stress and anxiety  Stress is your body's reaction to life changes and events, both good and bad. Most stress will last just a few hours, but stress can be ongoing and can lead to more than just stress. Although stress can play a major role in anxiety, it is not the same as anxiety. Stress is usually caused by something external, such as a deadline, test, or competition. Stress normally passes after the triggering event has ended.  Anxiety is caused by something internal, such as imagining a terrible outcome or worrying that something will   go wrong that will devastate you. Anxiety often does not go away even after the triggering event is over, and it can become long-term (chronic) worry. It is important to understand the differences between stress and anxiety and to manage your stress effectively so that it does not lead to an anxious response. Talk with your health care provider or a counselor to learn more about reducing anxiety and stress. He or she may suggest tension reduction techniques, such as:  Music therapy. This can include creating or listening to music that you enjoy and that inspires you.  Mindfulness-based meditation. This involves being aware of your normal breaths while not trying to control your breathing. It can be done while sitting or walking.  Centering prayer. This involves focusing on a word, phrase, or sacred image that means something to you and brings you  peace.  Deep breathing. To do this, expand your stomach and inhale slowly through your nose. Hold your breath for 3-5 seconds. Then exhale slowly, letting your stomach muscles relax.  Self-talk. This involves identifying thought patterns that lead to anxiety reactions and changing those patterns.  Muscle relaxation. This involves tensing muscles and then relaxing them. Choose a tension reduction technique that suits your lifestyle and personality. These techniques take time and practice. Set aside 5-15 minutes a day to do them. Therapists can offer counseling and training in these techniques. The training to help with anxiety may be covered by some insurance plans. Other things you can do to manage stress and anxiety include:  Keeping a stress/anxiety diary. This can help you learn what triggers your reaction and then learn ways to manage your response.  Thinking about how you react to certain situations. You may not be able to control everything, but you can control your response.  Making time for activities that help you relax and not feeling guilty about spending your time in this way.  Visual imagery and yoga can help you stay calm and relax.  Medicines Medicines can help ease symptoms. Medicines for anxiety include:  Anti-anxiety drugs.  Antidepressants. Medicines are often used as a primary treatment for anxiety disorder. Medicines will be prescribed by a health care provider. When used together, medicines, psychotherapy, and tension reduction techniques may be the most effective treatment. Relationships Relationships can play a big part in helping you recover. Try to spend more time connecting with trusted friends and family members. Consider going to couples counseling, taking family education classes, or going to family therapy. Therapy can help you and others better understand your condition. How to recognize changes in your anxiety Everyone responds differently to treatment for  anxiety. Recovery from anxiety happens when symptoms decrease and stop interfering with your daily activities at home or work. This may mean that you will start to:  Have better concentration and focus. Worry will interfere less in your daily thinking.  Sleep better.  Be less irritable.  Have more energy.  Have improved memory. It is important to recognize when your condition is getting worse. Contact your health care provider if your symptoms interfere with home or work and you feel like your condition is not improving. Follow these instructions at home: Activity  Exercise. Most adults should do the following: ? Exercise for at least 150 minutes each week. The exercise should increase your heart rate and make you sweat (moderate-intensity exercise). ? Strengthening exercises at least twice a week.  Get the right amount and quality of sleep. Most adults need 7-9 hours of sleep  each night. Lifestyle   Eat a healthy diet that includes plenty of vegetables, fruits, whole grains, low-fat dairy products, and lean protein. Do not eat a lot of foods that are high in solid fats, added sugars, or salt.  Make choices that simplify your life.  Do not use any products that contain nicotine or tobacco, such as cigarettes, e-cigarettes, and chewing tobacco. If you need help quitting, ask your health care provider.  Avoid caffeine, alcohol, and certain over-the-counter cold medicines. These may make you feel worse. Ask your pharmacist which medicines to avoid. General instructions  Take over-the-counter and prescription medicines only as told by your health care provider.  Keep all follow-up visits as told by your health care provider. This is important. Where to find support You can get help and support from these sources:  Self-help groups.  Online and community organizations.  A trusted spiritual leader.  Couples counseling.  Family education classes.  Family therapy. Where to  find more information You may find that joining a support group helps you deal with your anxiety. The following sources can help you locate counselors or support groups near you:  Mental Health [place]: www.mentalhealthamerica.net  Anxiety and Depression Association of America (ADAA): www.adaa.org  National Alliance on Mental Illness (NAMI): www.nami.org Contact a health care provider if you:  Have a hard time staying focused or finishing daily tasks.  Spend many hours a day feeling worried about everyday life.  Become exhausted by worry.  Start to have headaches, feel tense, or have nausea.  Urinate more than normal.  Have diarrhea. Get help right away if you have:  A racing heart and shortness of breath.  Thoughts of hurting yourself or others. If you ever feel like you may hurt yourself or others, or have thoughts about taking your own life, get help right away. You can go to your nearest emergency department or call:  Your local emergency services (911 in the [place]  A suicide crisis helpline, such as the National Suicide Prevention Lifeline at 1-800-273-8255. This is open 24 hours a day. Summary  Taking steps to learn and use tension reduction techniques can help calm you and help prevent triggering an anxiety reaction.  When used together, medicines, psychotherapy, and tension reduction techniques may be the most effective treatment.  Family, friends, and partners can play a big part in helping you recover from an anxiety disorder. This information is not intended to replace advice given to you by your health care provider. Make sure you discuss any questions you have with your health care provider. Document Revised: 06/19/2018 Document Reviewed: 06/19/2018 Elsevier Patient Education  2020 [employer]  Living With Depression Everyone experiences occasional disappointment, sadness, and loss in their lives. When you are feeling down, blue, or sad for at least 2 weeks  in a row, it may mean that you have depression. Depression can affect your thoughts and feelings, relationships, daily activities, and physical health. It is caused by changes in the way your brain functions. If you receive a diagnosis of depression, your health care provider will tell you which type of depression you have and what treatment options are available to you. If you are living with depression, there are ways to help you recover from it and also ways to prevent it from coming back. How to cope with lifestyle changes Coping with stress     Stress is your body's reaction to life changes and events, both good and bad. Stressful situations may include:    Getting married.  The death of a spouse.  Losing a job.  Retiring.  Having a baby. Stress can last just a few hours or it can be ongoing. Stress can play a major role in depression, so it is important to learn both how to cope with stress and how to think about it differently. Talk with your health care provider or a counselor if you would like to learn more about stress reduction. He or she may suggest some stress reduction techniques, such as:  Music therapy. This can include creating music or listening to music. Choose music that you enjoy and that inspires you.  Mindfulness-based meditation. This kind of meditation can be done while sitting or walking. It involves being aware of your normal breaths, rather than trying to control your breathing.  Centering prayer. This is a kind of meditation that involves focusing on a spiritual word or phrase. Choose a word, phrase, or sacred image that is meaningful to you and that brings you peace.  Deep breathing. To do this, expand your stomach and inhale slowly through your nose. Hold your breath for 3-5 seconds, then exhale slowly, allowing your stomach muscles to relax.  Muscle relaxation. This involves intentionally tensing muscles then relaxing them. Choose a stress reduction  technique that fits your lifestyle and personality. Stress reduction techniques take time and practice to develop. Set aside 5-15 minutes a day to do them. Therapists can offer training in these techniques. The training may be covered by some insurance plans. Other things you can do to manage stress include:  Keeping a stress diary. This can help you learn what triggers your stress and ways to control your response.  Understanding what your limits are and saying no to requests or events that lead to a schedule that is too full.  Thinking about how you respond to certain situations. You may not be able to control everything, but you can control how you react.  Adding humor to your life by watching funny films or TV shows.  Making time for activities that help you relax and not feeling guilty about spending your time this way.  Medicines Your health care provider may suggest certain medicines if he or she feels that they will help improve your condition. Avoid using alcohol and other substances that may prevent your medicines from working properly (may interact). It is also important to:  Talk with your pharmacist or health care provider about all the medicines that you take, their possible side effects, and what medicines are safe to take together.  Make it your goal to take part in all treatment decisions (shared decision-making). This includes giving input on the side effects of medicines. It is best if shared decision-making with your health care provider is part of your total treatment plan. If your health care provider prescribes a medicine, you may not notice the full benefits of it for 4-8 weeks. Most people who are treated for depression need to be on medicine for at least 6-12 months after they feel better. If you are taking medicines as part of your treatment, do not stop taking medicines without first talking to your health care provider. You may need to have the medicine slowly decreased  (tapered) over time to decrease the risk of harmful side effects. Relationships Your health care provider may suggest family therapy along with individual therapy and drug therapy. While there may not be family problems that are causing you to feel depressed, it is still important to make   sure your family learns as much as they can about your mental health. Having your family's support can help make your treatment successful. How to recognize changes in your condition Everyone has a different response to treatment for depression. Recovery from major depression happens when you have not had signs of major depression for two months. This may mean that you will start to:  Have more interest in doing activities.  Feel less hopeless than you did 2 months ago.  Have more energy.  Overeat less often, or have better or improving appetite.  Have better concentration. Your health care provider will work with you to decide the next steps in your recovery. It is also important to recognize when your condition is getting worse. Watch for these signs:  Having fatigue or low energy.  Eating too much or too little.  Sleeping too much or too little.  Feeling restless, agitated, or hopeless.  Having trouble concentrating or making decisions.  Having unexplained physical complaints.  Feeling irritable, angry, or aggressive. Get help as soon as you or your family members notice these symptoms coming back. How to get support and help from others How to talk with friends and family members about your condition  Talking to friends and family members about your condition can provide you with one way to get support and guidance. Reach out to trusted friends or family members, explain your symptoms to them, and let them know that you are working with a health care provider to treat your depression. Financial resources Not all insurance plans cover mental health care, so it is important to check with your  insurance carrier. If paying for co-pays or counseling services is a problem, search for a local or county mental health care center. They may be able to offer public mental health care services at low or no cost when you are not able to see a private health care provider. If you are taking medicine for depression, you may be able to get the generic form, which may be less expensive. Some makers of prescription medicines also offer help to patients who cannot afford the medicines they need. Follow these instructions at home:   Get the right amount and quality of sleep.  Cut down on using caffeine, tobacco, alcohol, and other potentially harmful substances.  Try to exercise, such as walking or lifting small weights.  Take over-the-counter and prescription medicines only as told by your health care provider.  Eat a healthy diet that includes plenty of vegetables, fruits, whole grains, low-fat dairy products, and lean protein. Do not eat a lot of foods that are high in solid fats, added sugars, or salt.  Keep all follow-up visits as told by your health care provider. This is important. Contact a health care provider if:  You stop taking your antidepressant medicines, and you have any of these symptoms: ? Nausea. ? Headache. ? Feeling lightheaded. ? Chills and body aches. ? Not being able to sleep (insomnia).  You or your friends and family think your depression is getting worse. Get help right away if:  You have thoughts of hurting yourself or others. If you ever feel like you may hurt yourself or others, or have thoughts about taking your own life, get help right away. You can go to your nearest emergency department or call:  Your local emergency services (911 in the [place]  A suicide crisis helpline, such as the National Suicide Prevention Lifeline at 1-800-273-8255. This is open 24-hours a day. Summary    If you are living with depression, there are ways to help you recover from it  and also ways to prevent it from coming back.  Work with your health care team to create a management plan that includes counseling, stress management techniques, and healthy lifestyle habits. This information is not intended to replace advice given to you by your health care provider. Make sure you discuss any questions you have with your health care provider. Document Revised: 05/11/2018 Document Reviewed: 12/21/2015 Elsevier Patient Education  2020 [employer]  

## 2019-07-10 NOTE — Progress Notes (Signed)
Subjective:     Katelyn Zimmerman is a 44 y.o. female and is here for a comprehensive physical exam. The patient reports problems - increased vaginal d/c, feeling less energy when on zoloft.  Pt taking zoloft at night as makes her feel calm.  May take med every other day  Notes increased energy on days not taking med.  Pt notes increased vaginal d/c, without irritation or odor.  Pt notes discomfort with paps.  Has nexplanon in LUE.  Social History   Socioeconomic History  . Marital status: Single    Spouse name: Not on file  . Number of children: Not on file  . Years of education: Not on file  . Highest education level: Not on file  Occupational History  . Not on file  Tobacco Use  . Smoking status: Current Every Day Smoker    Packs/day: 0.25    Years: 20.00    Pack years: 5.00  . Smokeless tobacco: Never Used  Substance and Sexual Activity  . Alcohol use: Yes    Alcohol/week: 2.0 standard drinks    Types: 2 Shots of liquor per week    Comment: occasionally  . Drug use: No  . Sexual activity: Yes    Birth control/protection: I.U.D.  Other Topics Concern  . Not on file  Social History Narrative  . Not on file   Social Determinants of Health   Financial Resource Strain:   . Difficulty of Paying Living Expenses:   Food Insecurity:   . Worried About Charity fundraiser in the Last Year:   . Arboriculturist in the Last Year:   Transportation Needs:   . Film/video editor (Medical):   Marland Kitchen Lack of Transportation (Non-Medical):   Physical Activity:   . Days of Exercise per Week:   . Minutes of Exercise per Session:   Stress:   . Feeling of Stress :   Social Connections:   . Frequency of Communication with Friends and Family:   . Frequency of Social Gatherings with Friends and Family:   . Attends Religious Services:   . Active Member of Clubs or Organizations:   . Attends Archivist Meetings:   Marland Kitchen Marital Status:   Intimate Partner Violence:   . Fear  of Current or Ex-Partner:   . Emotionally Abused:   Marland Kitchen Physically Abused:   . Sexually Abused:    Health Maintenance  Topic Date Due  . Hepatitis C Screening  Never done  . COVID-19 Vaccine (1) Never done  . PAP SMEAR-Modifier  01/20/2019  . INFLUENZA VACCINE  09/01/2019  . TETANUS/TDAP  08/22/2022  . HIV Screening  Completed    The following portions of the patient's history were reviewed and updated as appropriate: allergies, current medications, past family history, past medical history, past social history, past surgical history and problem list.  Review of Systems Pertinent items noted in HPI and remainder of comprehensive ROS otherwise negative.   Objective:    BP 130/84 (BP Location: Right Arm, Patient Position: Sitting, Cuff Size: Large)   Pulse 68   Temp 98.1 F (36.7 C) (Temporal)   Ht 5\' 6"  (1.676 m)   Wt 208 lb 12.8 oz (94.7 kg)   LMP 06/08/2019   SpO2 99%   BMI 33.70 kg/m  General appearance: alert, cooperative and no distress Head: Normocephalic, without obvious abnormality, atraumatic Eyes: conjunctivae/corneas clear. PERRL, EOM's intact. Fundi benign. Ears: normal TM's and external ear canals both ears Nose: Nares  normal. Septum midline. Mucosa normal. No drainage or sinus tenderness. Throat: lips, mucosa, and tongue normal; teeth and gums normal Neck: no adenopathy, no carotid bruit, no JVD, supple, symmetrical, trachea midline and thyroid not enlarged, symmetric, no tenderness/mass/nodules Lungs: clear to auscultation bilaterally Heart: regular rate and rhythm, S1, S2 normal, no murmur, click, rub or gallop Abdomen: soft, non-tender; bowel sounds normal; no masses,  no organomegaly Pelvic: cervix normal in appearance, external genitalia normal, no adnexal masses or tenderness, no cervical motion tenderness, uterus normal size, shape, and consistency and thin whitish d/c noted at cervical os. Extremities: extremities normal, atraumatic, no cyanosis or  edema Pulses: 2+ and symmetric Skin: Skin color, texture, turgor normal. No rashes or lesions Lymph nodes: Cervical, supraclavicular, and axillary nodes normal. Neurologic: Alert and oriented X 3, normal strength and tone. Normal symmetric reflexes. Normal coordination and gait    Assessment:    Healthy female exam.      Plan:     Anticipatory guidance given including wearing seatbelts, smoke detectors in the home, increasing physical activity, increasing p.o. intake of water and vegetables. -will obtain labs -pap done this visit -pt to schedule a mammogram -given handout -next CPE in 1 yr See After Visit Summary for Counseling Recommendations    Anxiety and depression  -PHQ 9 score 4 -GAD 7 score 3 -Continue Zoloft 50 mg dailly -consider counseling. - Plan: T4, Free, TSH  Cervical cancer screening  - Plan: PAP [[facility]]  Screening for cholesterol level  - Plan: Lipid panel  Obesity due to excess calories without serious comorbidity, unspecified classification  - Plan: Hemoglobin A1c  Routine screening for STI (sexually transmitted infection)  -We will obtain GC, chart, yeast, BV via Pap - Plan: HIV antibody (with reflex), RPR  F/u prn  Grier Mitts, MD

## 2019-07-12 LAB — CYTOLOGY - PAP
Chlamydia: NEGATIVE
Comment: NEGATIVE
Comment: NEGATIVE
Comment: NEGATIVE
Comment: NORMAL
Diagnosis: NEGATIVE
High risk HPV: NEGATIVE
Neisseria Gonorrhea: NEGATIVE
Trichomonas: NEGATIVE

## 2019-11-05 ENCOUNTER — Telehealth: Payer: Self-pay | Admitting: Family Medicine

## 2019-11-05 NOTE — Telephone Encounter (Signed)
Pt is calling in stating that she need a refill on sertraline (ZOLOFT) 50 MG  Pharm:  [facility] on [address]

## 2019-11-06 ENCOUNTER — Other Ambulatory Visit: Payer: Self-pay

## 2019-11-06 DIAGNOSIS — F32A Depression, unspecified: Secondary | ICD-10-CM

## 2019-11-06 MED ORDER — SERTRALINE HCL 50 MG PO TABS: 50.0000 mg | ORAL_TABLET | Freq: Every day | ORAL | 0 refills | Status: DC

## 2019-11-06 NOTE — Telephone Encounter (Signed)
Refill sent.

## 2020-01-31 ENCOUNTER — Other Ambulatory Visit: Payer: Self-pay | Admitting: Family Medicine

## 2020-01-31 DIAGNOSIS — F419 Anxiety disorder, unspecified: Secondary | ICD-10-CM

## 2020-01-31 DIAGNOSIS — F32A Depression, unspecified: Secondary | ICD-10-CM

## 2020-02-05 NOTE — Telephone Encounter (Signed)
Spoke with pt scheduled appointment for medication review/refill for 02/06/2020

## 2020-02-05 NOTE — Telephone Encounter (Signed)
Pt is calling needing a refill on Rx sertraline (ZOLOFT) 50 MG due to her being out.   Pharm:  [facility]

## 2020-02-06 ENCOUNTER — Encounter: Payer: Self-pay | Admitting: Family Medicine

## 2020-02-06 ENCOUNTER — Telehealth (INDEPENDENT_AMBULATORY_CARE_PROVIDER_SITE_OTHER): Payer: Commercial Managed Care - PPO | Admitting: Family Medicine

## 2020-02-06 DIAGNOSIS — F419 Anxiety disorder, unspecified: Secondary | ICD-10-CM

## 2020-02-06 DIAGNOSIS — R5383 Other fatigue: Secondary | ICD-10-CM

## 2020-02-06 DIAGNOSIS — F32A Depression, unspecified: Secondary | ICD-10-CM

## 2020-02-06 MED ORDER — SERTRALINE HCL 50 MG PO TABS: 50.0000 mg | ORAL_TABLET | Freq: Every day | ORAL | 0 refills | Status: DC

## 2020-02-06 NOTE — Progress Notes (Signed)
Virtual Visit via Telephone Note Patient unable to hear this provider on video visit and this provider unable to see patient on video visit. I connected with Katelyn Zimmerman on 02/06/20 at  3:30 PM EST by telephone and verified that I am speaking with the correct person using two identifiers.   I discussed the limitations, risks, security and privacy concerns of performing an evaluation and management service by telephone and the availability of in person appointments. I also discussed with the patient that there may be a patient responsible charge related to this service. The patient expressed understanding and agreed to proceed.  Location patient: home Location provider: work or home office Participants present for the call: patient, provider Patient did not have a visit in the prior 7 days to address this/these issue(s).   History of Present Illness: Pt feeling sluggish. Appetite is increased, wants to sleep all the time.  Maintaining weight.  Not eating any fried foods, mostly baked foods.  Pt notes feeling ok on zoloft 50 mg.  Taking med at night as it makes her sleepy.  Patient notes she did not get her labs done from her CPE in June.  Inquires about having labs drawn.   Observations/Objective: Patient sounds cheerful and well on the phone. I do not appreciate any SOB. Speech and thought processing are grossly intact. Patient reported vitals:  Assessment and Plan: Fatigue, unspecified type -Discussed possible causes -Patient encouraged to make a lab appointment to recheck vitamin D and have labs drawn from previous CPE over the summer. -Discussed supportive care including exercise, hydration, etc.  - Plan: Vitamin D, 25-hydroxy  Anxiety and depression -PHQ-9 score 10 this visit -PHQ-9 score slightly increased from last visit we will continue Zoloft 50 mg -Plan to obtain labs including TSH, free T4 -We will continue to monitor and plan for wean of Zoloft in the future -  Plan: sertraline (ZOLOFT) 50 MG tablet  Follow Up Instructions:  F/u after lab visit   [id number] 5-10 [id number] 11-20 9443 21-30 I did not refer this patient for an OV in the next 24 hours for this/these issue(s).  I discussed the assessment and treatment plan with the patient. The patient was provided an opportunity to ask questions and all were answered. The patient agreed with the plan and demonstrated an understanding of the instructions.   The patient was advised to call back or seek an in-person evaluation if the symptoms worsen or if the condition fails to improve as anticipated.  I provided 15 minutes of non-face-to-face time during this encounter.   Deeann Saint, MD

## 2020-02-10 ENCOUNTER — Other Ambulatory Visit: Payer: Self-pay

## 2020-02-10 ENCOUNTER — Other Ambulatory Visit (INDEPENDENT_AMBULATORY_CARE_PROVIDER_SITE_OTHER): Payer: Commercial Managed Care - PPO

## 2020-02-10 DIAGNOSIS — F32A Depression, unspecified: Secondary | ICD-10-CM | POA: Diagnosis not present

## 2020-02-10 DIAGNOSIS — E6609 Other obesity due to excess calories: Secondary | ICD-10-CM | POA: Diagnosis not present

## 2020-02-10 DIAGNOSIS — Z13220 Encounter for screening for lipoid disorders: Secondary | ICD-10-CM

## 2020-02-10 DIAGNOSIS — Z0000 Encounter for general adult medical examination without abnormal findings: Secondary | ICD-10-CM | POA: Diagnosis not present

## 2020-02-10 DIAGNOSIS — R5383 Other fatigue: Secondary | ICD-10-CM

## 2020-02-10 DIAGNOSIS — Z113 Encounter for screening for infections with a predominantly sexual mode of transmission: Secondary | ICD-10-CM

## 2020-02-10 DIAGNOSIS — F419 Anxiety disorder, unspecified: Secondary | ICD-10-CM

## 2020-02-10 LAB — T4, FREE: Free T4: 0.79 ng/dL (ref 0.60–1.60)

## 2020-02-10 LAB — CBC WITH DIFFERENTIAL/PLATELET
Basophils Absolute: 0 10*3/uL (ref 0.0–0.1)
Basophils Relative: 0.9 % (ref 0.0–3.0)
Eosinophils Absolute: 0.2 10*3/uL (ref 0.0–0.7)
Eosinophils Relative: 4.2 % (ref 0.0–5.0)
HCT: 44.6 % (ref 36.0–46.0)
Hemoglobin: 14.8 g/dL (ref 12.0–15.0)
Lymphocytes Relative: 37.3 % (ref 12.0–46.0)
Lymphs Abs: 2.1 10*3/uL (ref 0.7–4.0)
MCHC: 33.2 g/dL (ref 30.0–36.0)
MCV: 87.2 fl (ref 78.0–100.0)
Monocytes Absolute: 0.2 10*3/uL (ref 0.1–1.0)
Monocytes Relative: 3.5 % (ref 3.0–12.0)
Neutro Abs: 3 10*3/uL (ref 1.4–7.7)
Neutrophils Relative %: 54.1 % (ref 43.0–77.0)
Platelets: 246 10*3/uL (ref 150.0–400.0)
RBC: 5.11 Mil/uL (ref 3.87–5.11)
RDW: 14.3 % (ref 11.5–15.5)
WBC: 5.6 10*3/uL (ref 4.0–10.5)

## 2020-02-10 LAB — BASIC METABOLIC PANEL
BUN: 15 mg/dL (ref 6–23)
CO2: 27 mEq/L (ref 19–32)
Calcium: 9.2 mg/dL (ref 8.4–10.5)
Chloride: 103 mEq/L (ref 96–112)
Creatinine, Ser: 0.94 mg/dL (ref 0.40–1.20)
GFR: 74 mL/min (ref >60.00)
Glucose, Bld: 122 mg/dL — ABNORMAL HIGH (ref 70–99)
Potassium: 3.6 mEq/L (ref 3.5–5.1)
Sodium: 137 mEq/L (ref 135–145)

## 2020-02-10 LAB — HEMOGLOBIN A1C: Hgb A1c MFr Bld: 5.8 % (ref 4.6–6.5)

## 2020-02-10 LAB — TSH: TSH: 2.19 u[IU]/mL (ref 0.35–4.50)

## 2020-02-10 LAB — VITAMIN D 25 HYDROXY (VIT D DEFICIENCY, FRACTURES): VITD: 16.16 ng/mL — ABNORMAL LOW (ref 30.00–100.00)

## 2020-02-10 NOTE — Addendum Note (Signed)
Addended by: Marrion Coy on: 02/10/2020 07:28 AM   Modules accepted: Orders

## 2020-02-11 LAB — LIPID PANEL
Cholesterol: 264 mg/dL — ABNORMAL HIGH (ref 0–200)
HDL: 60.7 mg/dL (ref >39.00)
LDL Cholesterol: 169 mg/dL — ABNORMAL HIGH (ref 0–99)
NonHDL: 203.38
Total CHOL/HDL Ratio: 4
Triglycerides: 171 mg/dL — ABNORMAL HIGH (ref 0.0–149.0)
VLDL: 34.2 mg/dL (ref 0.0–40.0)

## 2020-02-11 LAB — HIV ANTIBODY (ROUTINE TESTING W REFLEX): HIV 1&2 Ab, 4th Generation: NONREACTIVE

## 2020-02-11 LAB — RPR: RPR Ser Ql: NONREACTIVE

## 2020-02-13 ENCOUNTER — Other Ambulatory Visit: Payer: Self-pay | Admitting: Family Medicine

## 2020-02-13 DIAGNOSIS — E559 Vitamin D deficiency, unspecified: Secondary | ICD-10-CM

## 2020-02-13 MED ORDER — VITAMIN D (ERGOCALCIFEROL) 1.25 MG (50000 UNIT) PO CAPS: 50000.0000 [IU] | ORAL_CAPSULE | ORAL | 0 refills | Status: DC

## 2020-02-18 ENCOUNTER — Other Ambulatory Visit: Payer: Commercial Managed Care - PPO

## 2020-02-19 ENCOUNTER — Other Ambulatory Visit: Payer: Commercial Managed Care - PPO

## 2020-05-05 ENCOUNTER — Other Ambulatory Visit: Payer: Self-pay | Admitting: Family Medicine

## 2020-05-05 DIAGNOSIS — F32A Depression, unspecified: Secondary | ICD-10-CM

## 2020-05-05 DIAGNOSIS — F419 Anxiety disorder, unspecified: Secondary | ICD-10-CM

## 2020-05-15 ENCOUNTER — Other Ambulatory Visit: Payer: Self-pay | Admitting: Family Medicine

## 2020-05-15 DIAGNOSIS — E559 Vitamin D deficiency, unspecified: Secondary | ICD-10-CM

## 2020-07-16 ENCOUNTER — Emergency Department (HOSPITAL_COMMUNITY): Payer: Commercial Managed Care - PPO

## 2020-07-16 ENCOUNTER — Emergency Department (HOSPITAL_COMMUNITY)
Admission: EM | Admit: 2020-07-16 | Discharge: 2020-07-17 | Disposition: A | Discharge status: Home / Self Care | Payer: Commercial Managed Care - PPO | Attending: Emergency Medicine | Admitting: Emergency Medicine

## 2020-07-16 DIAGNOSIS — I10 Essential (primary) hypertension: Secondary | ICD-10-CM | POA: Insufficient documentation

## 2020-07-16 DIAGNOSIS — F17210 Nicotine dependence, cigarettes, uncomplicated: Secondary | ICD-10-CM | POA: Insufficient documentation

## 2020-07-16 DIAGNOSIS — R5383 Other fatigue: Secondary | ICD-10-CM | POA: Diagnosis not present

## 2020-07-16 DIAGNOSIS — R519 Headache, unspecified: Secondary | ICD-10-CM | POA: Diagnosis present

## 2020-07-16 DIAGNOSIS — Z7982 Long term (current) use of aspirin: Secondary | ICD-10-CM | POA: Insufficient documentation

## 2020-07-16 LAB — CBC WITH DIFFERENTIAL/PLATELET
Abs Immature Granulocytes: 0.02 10*3/uL (ref 0.00–0.07)
Basophils Absolute: 0.1 10*3/uL (ref 0.0–0.1)
Basophils Relative: 1 %
Eosinophils Absolute: 0.2 10*3/uL (ref 0.0–0.5)
Eosinophils Relative: 4 %
HCT: 46.6 % — ABNORMAL HIGH (ref 36.0–46.0)
Hemoglobin: 15 g/dL (ref 12.0–15.0)
Immature Granulocytes: 0 %
Lymphocytes Relative: 39 %
Lymphs Abs: 2.5 10*3/uL (ref 0.7–4.0)
MCH: 28.4 pg (ref 26.0–34.0)
MCHC: 32.2 g/dL (ref 30.0–36.0)
MCV: 88.3 fL (ref 80.0–100.0)
Monocytes Absolute: 0.6 10*3/uL (ref 0.1–1.0)
Monocytes Relative: 9 %
Neutro Abs: 3 10*3/uL (ref 1.7–7.7)
Neutrophils Relative %: 47 %
Platelets: 275 10*3/uL (ref 150–400)
RBC: 5.28 MIL/uL — ABNORMAL HIGH (ref 3.87–5.11)
RDW: 14.5 % (ref 11.5–15.5)
WBC: 6.3 10*3/uL (ref 4.0–10.5)
nRBC: 0 % (ref 0.0–0.2)

## 2020-07-16 LAB — URINALYSIS, ROUTINE W REFLEX MICROSCOPIC
Bacteria, UA: NONE SEEN
Bilirubin Urine: NEGATIVE
Glucose, UA: NEGATIVE mg/dL
Ketones, ur: NEGATIVE mg/dL
Leukocytes,Ua: NEGATIVE
Nitrite: NEGATIVE
Protein, ur: NEGATIVE mg/dL
Specific Gravity, Urine: 1.011 (ref 1.005–1.030)
pH: 5 (ref 5.0–8.0)

## 2020-07-16 LAB — I-STAT BETA HCG BLOOD, ED (MC, WL, AP ONLY): I-stat hCG, quantitative: <5 m[IU]/mL (ref <5)

## 2020-07-16 LAB — BASIC METABOLIC PANEL
Anion gap: 11 (ref 5–15)
BUN: 15 mg/dL (ref 6–20)
CO2: 24 mmol/L (ref 22–32)
Calcium: 9.3 mg/dL (ref 8.9–10.3)
Chloride: 102 mmol/L (ref 98–111)
Creatinine, Ser: 0.97 mg/dL (ref 0.44–1.00)
GFR, Estimated: >60 mL/min (ref >60)
Glucose, Bld: 83 mg/dL (ref 70–99)
Potassium: 3.9 mmol/L (ref 3.5–5.1)
Sodium: 137 mmol/L (ref 135–145)

## 2020-07-16 MED ORDER — HYDROCHLOROTHIAZIDE 25 MG PO TABS
25.0000 mg | ORAL_TABLET | Freq: Once | ORAL | Status: AC
  Administered 2020-07-16: 25 mg via ORAL
  Filled 2020-07-16: qty 1

## 2020-07-16 MED ORDER — LORAZEPAM 1 MG PO TABS
1.0000 mg | ORAL_TABLET | Freq: Once | ORAL | Status: AC
  Administered 2020-07-16: 1 mg via ORAL
  Filled 2020-07-16: qty 1

## 2020-07-16 MED ORDER — HYDRALAZINE HCL 10 MG PO TABS
10.0000 mg | ORAL_TABLET | Freq: Once | ORAL | Status: AC
  Administered 2020-07-16: 10 mg via ORAL
  Filled 2020-07-16: qty 1

## 2020-07-16 NOTE — ED Triage Notes (Signed)
Pt c/o HTN found at the end of her shift at work, denies personal history besides gestational, endorses familial hx of same. Endorses blurred vision, "faint" headache- says "like my head is too heavy," pain in back of neck/trap area. Denies N/V, dizziness

## 2020-07-16 NOTE — ED Notes (Signed)
Patient transported to MRI 

## 2020-07-16 NOTE — ED Provider Notes (Signed)
[facility] EMERGENCY DEPARTMENT Provider Note   CSN: [id number] Arrival date & time: 07/16/20  1733     History Chief Complaint  Patient presents with   Hypertension    Katelyn Zimmerman is a 45 y.o. female who presents with concern for significant hypertension at the end of her shift at work with SBP greater than 200 and DBP greater than 100.  She has history of gestational diabetes but does not have history of hypertension outside of pregnancy.  Endorses intermittent fuzzy vision and faint generalized headache intermittently.  She denies any nausea, vomiting, dizziness, lightheadedness, urinary symptoms, or decreased urine.  Denies any chest pain, shortness of breath, or palpitations.  She states she presented primarily for the significant elevation in her blood pressure.  I have personally reviewed this patient's medical records.  She has history of hyperlipidemia and depression in the past.  She is on Zoloft.  HPI     Past Medical History:  Diagnosis Date   Abnormal Pap smear    Depression    Fibroid    Hyperlipidemia    Hypertension    UTI (urinary tract infection)     Patient Active Problem List   Diagnosis Date Noted   Obese 09/30/2016   Fatigue 09/30/2016   Anxiety and depression 08/18/2016   Normal delivery 04/19/2013   Pregnancy induced hypertension 04/18/2013    Past Surgical History:  Procedure Laterality Date   CHOLECYSTECTOMY     CHOLECYSTECTOMY Bilateral 2001   NO PAST SURGERIES       OB History     Gravida  5   Para  3   Term  3   Preterm      AB  2   Living  3      SAB  1   IAB  1   Ectopic      Multiple      Live Births  1           Family History  Problem Relation Age of Onset   Arthritis Mother    Hypertension Mother    Hypertension Father    Diabetes Father     Social History   Tobacco Use   Smoking status: Every Day    Packs/day: 0.25    Years: 20.00    Pack years: 5.00     Types: Cigarettes   Smokeless tobacco: Never  Vaping Use   Vaping Use: Never used  Substance Use Topics   Alcohol use: Yes    Alcohol/week: 2.0 standard drinks    Types: 2 Shots of liquor per week    Comment: occasionally   Drug use: No    Home Medications Prior to Admission medications   Medication Sig Start Date End Date Taking? Authorizing Provider  sertraline (ZOLOFT) 50 MG tablet TAKE 1 TABLET BY MOUTH EVERY DAY 05/05/20  Yes [name], MD  Vitamin D, Ergocalciferol, (DRISDOL) 1.25 MG (50000 UNIT) CAPS capsule Take 1 capsule (50,000 Units total) by mouth every 7 (seven) days. Patient not taking: Reported on 07/16/2020 02/13/20   [name], MD    Allergies    Patient has no known allergies.  Review of Systems   Review of Systems  Constitutional: Negative.   HENT: Negative.    Eyes:  Positive for visual disturbance. Negative for photophobia, pain, discharge, redness and itching.  Respiratory: Negative.    Cardiovascular: Negative.   Gastrointestinal: Negative.   Genitourinary: Negative.   Musculoskeletal: Negative.  Skin: Negative.   Neurological:  Positive for headaches. Negative for dizziness, tremors, seizures, syncope, facial asymmetry, weakness and light-headedness.   Physical Exam Updated Vital Signs BP (!) 150/81 (BP Location: Right Arm)   Pulse 80   Temp 97.9 F (36.6 C) (Oral)   Resp 18   SpO2 99%   Physical Exam Vitals and nursing note reviewed.  Constitutional:      Appearance: She is not ill-appearing or toxic-appearing.  HENT:     Head: Normocephalic and atraumatic.     Nose: Nose normal.     Mouth/Throat:     Mouth: Mucous membranes are moist.     Pharynx: Oropharynx is clear. Uvula midline. No oropharyngeal exudate, posterior oropharyngeal erythema or uvula swelling.     Tonsils: No tonsillar exudate.  Eyes:     General: Lids are normal. Vision grossly intact.        Right eye: No discharge.        Left eye: No discharge.      Extraocular Movements: Extraocular movements intact.     Conjunctiva/sclera: Conjunctivae normal.     Pupils: Pupils are equal, round, and reactive to light.  Neck:     Trachea: Trachea and phonation normal.  Cardiovascular:     Rate and Rhythm: Normal rate and regular rhythm.     Pulses: Normal pulses.     Heart sounds: Normal heart sounds. No murmur heard. Pulmonary:     Effort: Pulmonary effort is normal. No tachypnea, bradypnea, accessory muscle usage, prolonged expiration or respiratory distress.     Breath sounds: Normal breath sounds. No wheezing or rales.  Chest:     Chest wall: No mass, lacerations, deformity, swelling, tenderness, crepitus or edema.  Abdominal:     General: Bowel sounds are normal. There is no distension.     Palpations: Abdomen is soft.     Tenderness: There is no abdominal tenderness. There is no right CVA tenderness or left CVA tenderness.  Musculoskeletal:        General: No deformity.     Cervical back: Normal range of motion and neck supple. No edema, rigidity or crepitus. No pain with movement, spinous process tenderness or muscular tenderness.     Right lower leg: No edema.     Left lower leg: No edema.  Lymphadenopathy:     Cervical: No cervical adenopathy.  Skin:    General: Skin is warm and dry.     Capillary Refill: Capillary refill takes less than 2 seconds.     Findings: No rash.  Neurological:     General: No focal deficit present.     Mental Status: She is alert and oriented to person, place, and time. Mental status is at baseline.     GCS: GCS eye subscore is 4. GCS verbal subscore is 5. GCS motor subscore is 6.     Cranial Nerves: Cranial nerves are intact.     Sensory: Sensation is intact.     Motor: Motor function is intact.     Coordination: Coordination is intact.     Gait: Gait is intact.  Psychiatric:        Mood and Affect: Mood normal.    ED Results / Procedures / Treatments   Labs (all labs ordered are listed, but only  abnormal results are displayed) Labs Reviewed  CBC WITH DIFFERENTIAL/PLATELET - Abnormal; Notable for the following components:      Result Value   RBC 5.28 (*)    HCT 46.6 (*)  All other components within normal limits  URINALYSIS, ROUTINE W REFLEX MICROSCOPIC - Abnormal; Notable for the following components:   Color, Urine STRAW (*)    Hgb urine dipstick MODERATE (*)    All other components within normal limits  URINE CULTURE  BASIC METABOLIC PANEL  I-STAT BETA HCG BLOOD, ED ([facility], [facility], [facility] ONLY)    EKG EKG: normal sinus rhythm, no STEMI.   Radiology CT Head Wo Contrast  Result Date: 07/16/2020 CLINICAL DATA:  [place] and hip the back of her head. Dizziness. EXAM: CT HEAD WITHOUT CONTRAST TECHNIQUE: Contiguous axial images were obtained from the base of the skull through the vertex without intravenous contrast. COMPARISON:  None. FINDINGS: Brain: There is no evidence for acute hemorrhage, hydrocephalus, mass lesion, or abnormal extra-axial fluid collection. No definite CT evidence for acute infarction. Small lacunar infarct noted posterior left cerebellum. Vascular: No hyperdense vessel or unexpected calcification. Skull: No evidence for fracture. No worrisome lytic or sclerotic lesion. Sinuses/Orbits: Chronic mucosal thickening noted ethmoid air cells and maxillary sinuses. Visualized portions of the globes and intraorbital fat are unremarkable. Other: None. IMPRESSION: 1. 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 While likely nonacute, given the history of dizziness, MRI could be used to further evaluate as clinically warranted. 2. Chronic paranasal sinus disease. Electronically Signed   By: Eric  Mansell M.D.   On: 07/16/2020 18:54   MR BRAIN WO CONTRAST  Result Date: 07/17/2020 CLINICAL DATA:  Headache and blurry vision EXAM: MRI HEAD WITHOUT CONTRAST TECHNIQUE: Multiplanar, multiecho pulse sequences of the brain and surrounding structures were obtained without  intravenous contrast. COMPARISON:  None. FINDINGS: Brain: No acute infarct, mass effect or extra-axial collection. No acute or chronic hemorrhage. Normal white matter signal, parenchymal volume and CSF spaces. The midline structures are normal. 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 Vascular: Major flow voids are preserved. Skull and upper cervical spine: Normal calvarium and skull base. Visualized upper cervical spine and soft tissues are normal. Sinuses/Orbits:No paranasal sinus fluid levels or advanced mucosal thickening. No mastoid or middle ear effusion. Normal orbits. IMPRESSION: 1. No acute intracranial abnormality. 2. 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 Electronically Signed   By: Kevin  Herman M.D.   On: 07/17/2020 00:18    Procedures Procedures   Medications Ordered in ED Medications  hydrochlorothiazide (HYDRODIURIL) tablet 25 mg (25 mg Oral Given 07/16/20 1818)  hydrALAZINE (APRESOLINE) tablet 10 mg (10 mg Oral Given 07/16/20 2156)  LORazepam (ATIVAN) tablet 1 mg (1 mg Oral Given 07/16/20 2300)    ED Course  I have reviewed the triage vital signs and the nursing notes.  Pertinent labs & imaging results that were available during my care of the patient were reviewed by me and considered in my medical decision making (see chart for details).  Clinical Course as of 07/17/20 0204  Thu Jul 16, 2020  2158 RBC(!): 5.28 [RS]    Clinical Course User Index [RS] Loribeth Katich R, PA-C   MDM Rules/Calculators/A&P                         44  year old female presents with concern for intermittent vague headaches and hypertension.  Differential diagnose includes not limited to hypertensive headache/hypertensive urgency/emergency, subarachnoid hemorrhage, TIA/CVA, mass-effect, tension type headache, migraine.  Hypertensive on intake, vital signs otherwise normal.  She was administered HCTZ in triage.  At time initial exam she remains severely  hypertensive at this time,  will proceed with oral hydralazine.  Cardiopulmonary exam is normal, abdominal exam is benign.  No focal deficit on neurologic exam.  CBC unremarkable, BMP unremarkable, UA unremarkable, patient is not pregnant.  CT scan obtained in triage revealed small lacunar infarct in the posterior left cerebellum, radiologist recommended MRI.  MRI of the brain obtained which revealed old small vessel infarct of the left cerebellar hemisphere.  Case discussed with neurologist, Dr. Theda Sers, who recommends daily baby aspirin, antihypertensive medications, and close PCP follow-up.  I appreciate his collaboration in the care of this patient.  No further work-up warranted in the this time given reassuring work-up and imaging without acute finding. Patient's presentation most consistent with hypertensive headache. Will discharge with low dose antihypertensive.   Justiss voiced understanding of her medical evaluation and treatment plan.  Each of her questions was answered to her expressed satisfaction.  Return precautions are given.  Patient is well-appearing, stable, and appropriate for discharge at this time.  This chart was dictated using voice recognition software, Dragon. Despite the best efforts of this provider to proofread and correct errors, errors may still occur which can change documentation meaning.  Final Clinical Impression(s) / ED Diagnoses Final diagnoses:  Primary hypertension    Rx / DC Orders ED Discharge Orders     None        Aura Dials 07/17/20 4695    Charlesetta Shanks, MD 07/20/20 [phone]

## 2020-07-16 NOTE — ED Provider Notes (Signed)
Emergency Medicine Provider Triage Evaluation Note  Katelyn Zimmerman , a 45 y.o. female  was evaluated in triage.  Pt complains of headaches, blurred vision, nausea.  She was taking her blood pressure at work and noted it to be high.  She has not had diagnosed with high blood pressure before. Also states 2 days ago she slipped and fell, striking the back of her head on the ground.  Review of Systems  Positive: HTN, blurred vision, headache, nausea Negative: Syncope, chest pain, shortness of breath, double vision, neurodeficits  Physical Exam  BP (!) 190/103 (BP Location: Right Arm)   Pulse 72   Temp 98.3 F (36.8 C) (Oral)   Resp 16   SpO2 100%  Gen:   Awake, no distress   Resp:  Normal effort  MSK:   Moves extremities without difficulty  Other:    Medical Decision Making  Medically screening exam initiated at 6:06 PM.  Appropriate orders placed.  Katelyn Zimmerman was informed that the remainder of the evaluation will be completed by another provider, this initial triage assessment does not replace that evaluation, and the importance of remaining in the ED until their evaluation is complete.     Katelyn Bender, PA-C 07/16/20 1816    Katelyn Gibbs, DO 07/16/20 2220

## 2020-07-17 ENCOUNTER — Other Ambulatory Visit: Payer: Self-pay | Admitting: Family Medicine

## 2020-07-17 DIAGNOSIS — F32A Depression, unspecified: Secondary | ICD-10-CM

## 2020-07-17 MED ORDER — LISINOPRIL 5 MG PO TABS: 5.0000 mg | ORAL_TABLET | Freq: Every day | ORAL | 0 refills | Status: DC

## 2020-07-17 NOTE — Discharge Instructions (Addendum)
You were seen in the ER today for your high blood pressure.  Your physical exam, vital signs, blood work, were very reassuring. Your CT scan revealed evidence of an old tiny infarct, which is loss of blood flow to the area in your brain; this was confirmed with MRI. This change occurred in the past, and has not occurred recently. Your case was discussed with a neurologist who feels that this is most likely caused by your blood pressure being poorly controlled.  He recommends proceeding with daily baby aspirin of 81 mg every day, as well as beginning antihypertensive medications. You should follow-up closely with your primary care doctor in the next 1 to 2 weeks for reevaluation of your blood pressure and adjustments as necessary to your blood pressure medications.  I have included the neurology office contact information below should you feel inclined to follow-up with them.  Return to the ER if you develop any new dizziness, blurred vision, double vision, chest pain, shortness of breath palpitations, decrease in your urine, or any other new severe symptoms.

## 2020-07-17 NOTE — ED Notes (Signed)
Patient verbalized understanding of discharge instructions. Opportunity for questions and answers.  

## 2020-07-18 LAB — URINE CULTURE

## 2020-07-20 ENCOUNTER — Other Ambulatory Visit: Payer: Self-pay

## 2020-07-20 DIAGNOSIS — F32A Depression, unspecified: Secondary | ICD-10-CM

## 2020-07-20 DIAGNOSIS — F419 Anxiety disorder, unspecified: Secondary | ICD-10-CM

## 2020-07-20 MED ORDER — SERTRALINE HCL 50 MG PO TABS: 1.0000 | ORAL_TABLET | Freq: Every day | ORAL | 0 refills | Status: DC

## 2020-07-24 ENCOUNTER — Other Ambulatory Visit: Payer: Self-pay | Admitting: Family Medicine

## 2020-07-24 ENCOUNTER — Telehealth: Payer: Self-pay | Admitting: Family Medicine

## 2020-07-24 ENCOUNTER — Other Ambulatory Visit: Payer: Self-pay

## 2020-07-24 DIAGNOSIS — F32A Depression, unspecified: Secondary | ICD-10-CM

## 2020-07-24 DIAGNOSIS — F419 Anxiety disorder, unspecified: Secondary | ICD-10-CM

## 2020-07-24 NOTE — Telephone Encounter (Signed)
Called patient after speaking with medical assistant to let her know that Dr. Volanda Napoleon sent in [facility], and that there was a note to the pharmacy that a visit will be needed for further refills.  Patient stated she is going to call pharmacy and let them know this information.

## 2020-07-27 ENCOUNTER — Ambulatory Visit (INDEPENDENT_AMBULATORY_CARE_PROVIDER_SITE_OTHER): Payer: Commercial Managed Care - PPO | Admitting: Family Medicine

## 2020-07-27 ENCOUNTER — Encounter: Payer: Self-pay | Admitting: Family Medicine

## 2020-07-27 VITALS — BP 138/100 | HR 90 | Temp 98.3°F | Wt 214.0 lb

## 2020-07-27 DIAGNOSIS — R0989 Other specified symptoms and signs involving the circulatory and respiratory systems: Secondary | ICD-10-CM

## 2020-07-27 DIAGNOSIS — I10 Essential (primary) hypertension: Secondary | ICD-10-CM | POA: Diagnosis not present

## 2020-07-27 DIAGNOSIS — F419 Anxiety disorder, unspecified: Secondary | ICD-10-CM | POA: Diagnosis not present

## 2020-07-27 DIAGNOSIS — F322 Major depressive disorder, single episode, severe without psychotic features: Secondary | ICD-10-CM | POA: Diagnosis not present

## 2020-07-27 MED ORDER — BUPROPION HCL ER (XL) 150 MG PO TB24: 150.0000 mg | ORAL_TABLET | Freq: Every day | ORAL | 3 refills | Status: DC

## 2020-07-27 MED ORDER — LISINOPRIL 10 MG PO TABS: 10.0000 mg | ORAL_TABLET | Freq: Every day | ORAL | 3 refills | Status: DC

## 2020-07-27 NOTE — Progress Notes (Signed)
Subjective:    Patient ID: Katelyn Zimmerman, female    DOB: 12-16-1975, 45 y.o.   MRN: [id number]  Chief Complaint  Patient presents with   Medication Refill    Sertraline, states may need something else.    HPI Patient is a 45 yo female with pmh sig for gHTN, HLD, depression, anxiety who was seen today for f/u on BP after ED visit.  Pt seen in ED on 07/16/20 for HTN, 200s/100s.  Started on lisinopril 5 mg.  While in ED, CT head with small lacunar infarct in posterior L cerebellum.  MRI brain with old small vessel infarct of L cerebellar hemisphere.  Pt notes changes in vision since Feb, tension in R neck/shoulder into R upper arm causing pain.    Off Zoloft x 12 d.  Previously on Zoloft 100 mg.  Felt like anxiety was increased on med, was fidgety, didn't care, was drowsy, but would only sleep 4-5 hrs.  Past Medical History:  Diagnosis Date   Abnormal Pap smear    Depression    Fibroid    Hyperlipidemia    Hypertension    UTI (urinary tract infection)     No Known Allergies  ROS General: Denies fever, chills, night sweats, changes in weight, changes in appetite HEENT: Denies headaches, ear pain, rhinorrhea, sore throat  +changes in vision CV: Denies CP, palpitations, SOB, orthopnea Pulm: Denies SOB, cough, wheezing GI: Denies abdominal pain, nausea, vomiting, diarrhea, constipation GU: Denies dysuria, hematuria, frequency, vaginal discharge Msk: Denies muscle cramps, joint pains  +neck/shoulder pain Neuro: Denies weakness, numbness, tingling Skin: Denies rashes, bruising Psych: Denies hallucinations  +anxiety, depression      Objective:    Blood pressure (!) 138/100, pulse 90, temperature 98.3 F (36.8 C), temperature source Oral, weight 214 lb (97.1 kg), SpO2 95 %, unknown if currently breastfeeding.  R arm 138/100, L arm 162/105  Gen. Pleasant, well-nourished, in no distress, normal affect   HEENT: [place]/AT, face symmetric, conjunctiva clear, no scleral icterus,  PERRLA, EOMI, nares patent without drainage Neck: No JVD, no thyromegaly, L carotid bruit. Lungs: no accessory muscle use, CTAB, no wheezes or rales Cardiovascular: RRR, no m/r/g, no peripheral edema Musculoskeletal: No deformities, no cyanosis or clubbing, normal tone Neuro:  A&Ox3, CN II-XII intact, normal gait Skin:  Warm, no lesions/ rash   Wt Readings from Last 3 Encounters:  07/27/20 214 lb (97.1 kg)  07/10/19 208 lb 12.8 oz (94.7 kg)  12/14/17 209 lb (94.8 kg)    Lab Results  Component Value Date   WBC 6.3 07/16/2020   HGB 15.0 07/16/2020   HCT 46.6 (H) 07/16/2020   PLT 275 07/16/2020   GLUCOSE 83 07/16/2020   CHOL 264 (H) 02/10/2020   TRIG 171.0 (H) 02/10/2020   HDL 60.70 02/10/2020   LDLCALC 169 (H) 02/10/2020   ALT 16 09/30/2016   AST 18 09/30/2016   NA 137 07/16/2020   K 3.9 07/16/2020   CL 102 07/16/2020   CREATININE 0.97 07/16/2020   BUN 15 07/16/2020   CO2 24 07/16/2020   TSH 2.19 02/10/2020   HGBA1C 5.8 02/10/2020    Assessment/Plan:  Essential hypertension  -uncontrolled -Abrupt cessation of Zoloft possibly contributing -CT head in ED on 07/16/20 with a lacunar infarct in the posterior left cerebellum. Likely nonacute, MRI suggested to further evaluate.  Chronic paraspinal sinus dz. -MRI brain w/o contrast on 07/16/20 with no acute intracranial abnormality.  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 -lifestyle modifications -  Plan: lisinopril (ZESTRIL) 10 MG tablet, US Carotid Duplex Bilateral  Anxiety  -GAD-7 score 16 -Zoloft d/c'd -Counseling - Plan: buPROPion (WELLBUTRIN XL) 150 MG 24 hr tablet  Depression, major, single episode, severe (HCC) -PHQ-9 score 20 -Zoloft discontinued -Counseling -Given strict precautions  - Plan: buPROPion (WELLBUTRIN XL) 150 MG 24 hr tablet  Left carotid bruit  - Plan: US Carotid Duplex Bilateral  F/u in 4-6 wks, sooner if needed  Grier Mitts, MD

## 2020-07-27 NOTE — Patient Instructions (Signed)
Counseling resources - [email or web address] Located on [email or web address] -helps you find providers in your area.  Several options including in person or virtual visits.  [facility].  [place]ated in office park across from [place] off [address].  [facility]ist information centre manager Counseling and Psychologist, clinical.  Faith based counseling.  [facility]  Katelyn Zimmerman, LPC Typically works in the office, but doing a hybrid schedule due to COVID-19. Phone number is [phone] for appointments.

## 2020-08-10 ENCOUNTER — Ambulatory Visit
Admission: RE | Admit: 2020-08-10 | Discharge: 2020-08-10 | Disposition: A | Discharge status: Home / Self Care | Payer: Commercial Managed Care - PPO | Source: Ambulatory Visit | Attending: Family Medicine | Admitting: Family Medicine

## 2020-08-10 DIAGNOSIS — R0989 Other specified symptoms and signs involving the circulatory and respiratory systems: Secondary | ICD-10-CM

## 2020-08-10 DIAGNOSIS — I10 Essential (primary) hypertension: Secondary | ICD-10-CM

## 2020-08-11 ENCOUNTER — Encounter: Payer: Self-pay | Admitting: Family Medicine

## 2020-08-11 DIAGNOSIS — F322 Major depressive disorder, single episode, severe without psychotic features: Secondary | ICD-10-CM | POA: Insufficient documentation

## 2020-08-11 DIAGNOSIS — I10 Essential (primary) hypertension: Secondary | ICD-10-CM | POA: Insufficient documentation

## 2020-08-14 ENCOUNTER — Telehealth: Payer: Self-pay | Admitting: Family Medicine

## 2020-08-14 ENCOUNTER — Other Ambulatory Visit: Payer: Self-pay | Admitting: Family Medicine

## 2020-08-14 DIAGNOSIS — E041 Nontoxic single thyroid nodule: Secondary | ICD-10-CM

## 2020-08-14 NOTE — Telephone Encounter (Signed)
Called the pt due to the pt had been in contact with the Triage Nurse and they had recommended that the pt go to the ER due to her Bp was 171/103 and the pt declined to go to the ER and wanted [place] to call the pt to let the pt know what she needed to do.  Pt was called to let her know that we recommend her to go to the ER and the called was dropped and when called back no answer.

## 2020-08-19 ENCOUNTER — Encounter: Payer: Self-pay | Admitting: Family Medicine

## 2020-08-19 ENCOUNTER — Other Ambulatory Visit: Payer: Self-pay | Admitting: Family Medicine

## 2020-08-19 ENCOUNTER — Telehealth (INDEPENDENT_AMBULATORY_CARE_PROVIDER_SITE_OTHER): Payer: Commercial Managed Care - PPO | Admitting: Family Medicine

## 2020-08-19 VITALS — BP 170/105

## 2020-08-19 DIAGNOSIS — F322 Major depressive disorder, single episode, severe without psychotic features: Secondary | ICD-10-CM

## 2020-08-19 DIAGNOSIS — R5383 Other fatigue: Secondary | ICD-10-CM

## 2020-08-19 DIAGNOSIS — F419 Anxiety disorder, unspecified: Secondary | ICD-10-CM

## 2020-08-19 DIAGNOSIS — I10 Essential (primary) hypertension: Secondary | ICD-10-CM

## 2020-08-19 DIAGNOSIS — F411 Generalized anxiety disorder: Secondary | ICD-10-CM | POA: Diagnosis not present

## 2020-08-19 DIAGNOSIS — E041 Nontoxic single thyroid nodule: Secondary | ICD-10-CM

## 2020-08-19 MED ORDER — LISINOPRIL 20 MG PO TABS: 20.0000 mg | ORAL_TABLET | Freq: Every day | ORAL | 3 refills | Status: DC

## 2020-08-19 MED ORDER — HYDROXYZINE HCL 10 MG PO TABS: 10.0000 mg | ORAL_TABLET | Freq: Three times a day (TID) | ORAL | 0 refills | Status: DC | PRN

## 2020-08-19 NOTE — Progress Notes (Signed)
Virtual Visit via Video Note  I connected with Katelyn Zimmerman on 08/19/20 at  4:00 PM EDT by a video enabled telemedicine application 2/2 [id number] pandemic and verified that I am speaking with the correct person using two identifiers.  Location patient: home Location provider:work or home office Persons participating in the virtual visit: patient, provider  I discussed the limitations of evaluation and management by telemedicine and the availability of in person appointments. The patient expressed understanding and agreed to proceed.  Chief Complaint  Patient presents with   Hypertension    HPI: Pt has questions about results from carotid ultrasound.  A nodule was noted in L thyroid.  U/S of thyroid scheduled next wk.  Pt notes anxiety has increased.  Off sertraline x 5 wks.  Feels like she is still withdrawing from it.  Drinking 10 bottles of water per day.  May only urinate 3 x per day.  No appetite, wants to sleep.  Has not looked into counseling.  Wellbutrin making pt care more, but feeling angry/annoyed at little things.     BP remains elevated, 170/100.  Taking lisinopril 10 mg.  Pt upset that she was advised to go to the ED last wk when her bp was elevated and she had a HA.  Pt states the HA was from the cold she had last wk.  PT didn't take anything as she did not know what she could take.  Pt typically feels light headed and dizzy.    Occasionally wakes herself up at night from snoring. Feels tired all the time.     ROS: See pertinent positives and negatives per HPI.  Past Medical History:  Diagnosis Date   Abnormal Pap smear    Depression    Fibroid    Hyperlipidemia    Hypertension    UTI (urinary tract infection)     Past Surgical History:  Procedure Laterality Date   CHOLECYSTECTOMY     CHOLECYSTECTOMY Bilateral 2001   NO PAST SURGERIES      Family History  Problem Relation Age of Onset   Arthritis Mother    Hypertension Mother    Hypertension Father     Diabetes Father      Current Outpatient Medications:    buPROPion (WELLBUTRIN XL) 150 MG 24 hr tablet, Take 1 tablet (150 mg total) by mouth daily., Disp: 30 tablet, Rfl: 3   lisinopril (ZESTRIL) 10 MG tablet, Take 1 tablet (10 mg total) by mouth daily., Disp: 90 tablet, Rfl: 3   sertraline (ZOLOFT) 50 MG tablet, Take 1 tablet (50 mg total) by mouth daily. (Patient not taking: Reported on 08/19/2020), Disp: 30 tablet, Rfl: 0   Vitamin D, Ergocalciferol, (DRISDOL) 1.25 MG (50000 UNIT) CAPS capsule, Take 1 capsule (50,000 Units total) by mouth every 7 (seven) days. (Patient not taking: No sig reported), Disp: 12 capsule, Rfl: 0  EXAM:  VITALS per patient if applicable: RR between 83-41 bpm, BP 170/100  GENERAL: alert, oriented, depressed affect, euthymic, and in no acute distress  HEENT: atraumatic, conjunctiva clear, no obvious abnormalities on inspection of external nose and ears  NECK: normal movements of the head and neck  LUNGS: on inspection no signs of respiratory distress, breathing rate appears normal, no obvious gross SOB, gasping or wheezing  CV: no obvious cyanosis  MS: moves all visible extremities without noticeable abnormality  PSYCH/NEURO: pleasant and cooperative, no obvious depression or anxiety, speech and thought processing grossly intact  ASSESSMENT AND PLAN:  Discussed the following assessment  and plan:  Essential hypertension  -Elevated -Continue lifestyle modifications -We will increase lisinopril from 10 mg to 20 mg daily. --Patient advised will likely need dose adjustments of BP meds and possibly additional medications - Plan: lisinopril (ZESTRIL) 20 MG tablet, Ambulatory referral to Sleep Studies  Depression, major, single episode, severe (HCC) -Continue Wellbutrin XL 150 mg daily -Advised to wait the full 4-6 weeks before dose adjustments or changes to med are made. -strongly encouraged to set up appointment psychiatry/BH for counseling  GAD  (generalized anxiety disorder) -Continue Wellbutrin XL 150 mg daily -Counseling encouraged -We will start hydroxyzine.  Advised it may cause drowsiness.  Okay to take half a tab if full tab causes drowsiness. -Plan: Hydroxyzine 10 mg 3 times daily as needed  Fatigue, unspecified type  -Discussed various causes including possibility of OSA, medications, depression - Plan: Ambulatory referral to Sleep Studies  Thyroid nodule -Encouraged to keep appointment for upcoming thyroid ultrasound.  Follow-up in 2-3 weeks, sooner if needed  I discussed the assessment and treatment plan with the patient. The patient was provided an opportunity to ask questions and all were answered. The patient agreed with the plan and demonstrated an understanding of the instructions.   The patient was advised to call back or seek an in-person evaluation if the symptoms worsen or if the condition fails to improve as anticipated.   [name], MD

## 2020-08-24 ENCOUNTER — Telehealth: Payer: Self-pay | Admitting: Family Medicine

## 2020-08-24 NOTE — Telephone Encounter (Signed)
Patient was transferred to triage for chief complaint: ---Caller states she has a fever of 101.0 (orally) and her BP is 139/90 - feels weak and dizzy as well. Caller states this happened last week and went away but came back last night.   Disp. Time Eilene Ghazi Time) Disposition Final User 08/24/2020 10:12:40 AM Go to ED Now Yes Doyle Askew, RN, Beth PLEASE NOTE: All timestamps contained within this report are represented as [place] Standard Time. CONFIDENTIALTY NOTICE: This fax transmission is intended only for the addressee. It contains information that is legally privileged, confidential or otherwise protected from use or disclosure. If you are not the intended recipient, you are strictly prohibited from reviewing, disclosing, copying using or disseminating any of this information or taking any action in reliance on or regarding this information. If you have received this fax in error, please notify us immediately by telephone so that we can arrange for its return to [place]. Phone: [phone], Toll-Free: [phone], Fax: [phone] Page: 2 of 2 Call Id: [id number] [place] Disagree/Comply Disagree Caller Understands Yes PreDisposition Call Doctor Care Advice Given Per Guideline GO TO ED NOW: * You need to be seen in the Emergency Department. * Go to the ED at ___________ [place] now. Drive carefully. * Another adult should drive. NOTE TO TRIAGER - DRIVING: BRING MEDICINES: * Bring a list of your current medicines when you go to the Emergency Department (ER). * Bring the pill bottles too. This will help the doctor (or NP/ PA) to make certain you are taking the right medicines and the right dose. CARE ADVICE given per Chest Pain (Adult) guideline. CALL EMS IF: * Severe difficulty breathing occurs * Passes out or becomes too weak to stand * You become worse * If immediate transportation is not available via car, rideshare (e.g., Lyft, Uber), or taxi, then the patient should be instructed to call  [id number]. Comments User: Melene Muller, RN Date/Time Eilene Ghazi Time): 08/24/2020 10:09:57 AM Caller does not want to go to ED...will notify office of caller's symptoms and disposition and refusal of going to ED per directive User: Melene Muller, RN Date/Time Eilene Ghazi Time): 08/24/2020 10:12:31 A

## 2020-08-25 ENCOUNTER — Other Ambulatory Visit: Payer: Commercial Managed Care - PPO

## 2020-08-26 ENCOUNTER — Telehealth (INDEPENDENT_AMBULATORY_CARE_PROVIDER_SITE_OTHER): Payer: Commercial Managed Care - PPO | Admitting: Family Medicine

## 2020-08-26 ENCOUNTER — Encounter: Payer: Self-pay | Admitting: Family Medicine

## 2020-08-26 DIAGNOSIS — U071 COVID-19: Secondary | ICD-10-CM | POA: Diagnosis not present

## 2020-08-26 DIAGNOSIS — I10 Essential (primary) hypertension: Secondary | ICD-10-CM | POA: Diagnosis not present

## 2020-08-26 MED ORDER — MOLNUPIRAVIR EUA 200MG CAPSULE
4.0000 | ORAL_CAPSULE | Freq: Two times a day (BID) | ORAL | 0 refills | Status: AC
Start: 2020-08-26 — End: 2020-08-31

## 2020-08-26 NOTE — Progress Notes (Signed)
Virtual Visit via Video Note  I connected with on 08/26/20 at  4:00 PM EDT by a video enabled telemedicine application 2/2 [id number] pandemic and verified that I am speaking with the correct person using two identifiers.  Location patient: home Location provider:work or home office Persons participating in the virtual visit: patient, provider  I discussed the limitations of evaluation and management by telemedicine and the availability of in person appointments. The patient expressed understanding and agreed to proceed.   HPI: Pt tested positive for covid yesterday 08/25/2020. Symptoms include body aches, productive cough, fever Tmax 102.8 F, sore throat, loose stools, decreased appetite.  Symptoms improving some. Denies HA, n/v, sick contacts. Symptoms started Sunday 08/23/20. Took ibuprofen, emergen C.  ROS: See pertinent positives and negatives per HPI.  Past Medical History:  Diagnosis Date   Abnormal Pap smear    Depression    Fibroid    Hyperlipidemia    Hypertension    UTI (urinary tract infection)     Past Surgical History:  Procedure Laterality Date   CHOLECYSTECTOMY     CHOLECYSTECTOMY Bilateral 2001   NO PAST SURGERIES      Family History  Problem Relation Age of Onset   Arthritis Mother    Hypertension Mother    Hypertension Father    Diabetes Father      Current Outpatient Medications:    buPROPion (WELLBUTRIN XL) 150 MG 24 hr tablet, Take 1 tablet (150 mg total) by mouth daily., Disp: 30 tablet, Rfl: 3   hydrOXYzine (ATARAX/VISTARIL) 10 MG tablet, Take 1 tablet (10 mg total) by mouth 3 (three) times daily as needed for anxiety., Disp: 45 tablet, Rfl: 0   lisinopril (ZESTRIL) 20 MG tablet, Take 1 tablet (20 mg total) by mouth daily., Disp: 30 tablet, Rfl: 3   Vitamin D, Ergocalciferol, (DRISDOL) 1.25 MG (50000 UNIT) CAPS capsule, Take 1 capsule (50,000 Units total) by mouth every 7 (seven) days. (Patient not taking: No sig reported), Disp: 12 capsule, Rfl:  0  EXAM:  VITALS per patient if applicable: RR between 123456 bpm  GENERAL: alert, oriented, appears well and in no acute distress  HEENT: atraumatic, conjunctiva clear, no obvious abnormalities on inspection of external nose and ears  NECK: normal movements of the head and neck  LUNGS: on inspection no signs of respiratory distress, breathing rate appears normal, no obvious gross SOB, gasping or wheezing  CV: no obvious cyanosis  MS: moves all visible extremities without noticeable abnormality  PSYCH/NEURO: pleasant and cooperative, no obvious depression or anxiety, speech and thought processing grossly intact  ASSESSMENT AND PLAN:  Discussed the following assessment and plan:  COVID-19 virus infection -Symptoms starting 7/24 with positive test 7/26 -Continue supportive care including rest, hydration, OTC cough/cold medication for HBP -r/b/a of antiviral medication.  Patient wishes to start. -Given precautions - Plan: molnupiravir EUA 200 mg CAPS  HTN -Avoid all medication with decongestant as may elevate BP -Continue current meds  Follow-up as needed   I discussed the assessment and treatment plan with the patient. The patient was provided an opportunity to ask questions and all were answered. The patient agreed with the plan and demonstrated an understanding of the instructions.   The patient was advised to call back or seek an in-person evaluation if the symptoms worsen or if the condition fails to improve as anticipated.   [name], MD

## 2020-08-31 ENCOUNTER — Other Ambulatory Visit: Payer: Self-pay | Admitting: Family Medicine

## 2020-08-31 DIAGNOSIS — F411 Generalized anxiety disorder: Secondary | ICD-10-CM

## 2020-09-02 ENCOUNTER — Ambulatory Visit
Admission: RE | Admit: 2020-09-02 | Discharge: 2020-09-02 | Disposition: A | Discharge status: Home / Self Care | Payer: Commercial Managed Care - PPO | Source: Ambulatory Visit | Attending: Family Medicine | Admitting: Family Medicine

## 2020-09-02 DIAGNOSIS — E041 Nontoxic single thyroid nodule: Secondary | ICD-10-CM

## 2020-09-18 ENCOUNTER — Other Ambulatory Visit: Payer: Self-pay | Admitting: Family Medicine

## 2020-09-18 DIAGNOSIS — F322 Major depressive disorder, single episode, severe without psychotic features: Secondary | ICD-10-CM

## 2020-09-18 DIAGNOSIS — F419 Anxiety disorder, unspecified: Secondary | ICD-10-CM

## 2020-11-19 ENCOUNTER — Other Ambulatory Visit: Payer: Self-pay | Admitting: Family Medicine

## 2020-11-19 DIAGNOSIS — I10 Essential (primary) hypertension: Secondary | ICD-10-CM

## 2020-12-25 ENCOUNTER — Other Ambulatory Visit: Payer: Self-pay | Admitting: Family Medicine

## 2020-12-25 DIAGNOSIS — F419 Anxiety disorder, unspecified: Secondary | ICD-10-CM

## 2020-12-25 DIAGNOSIS — F322 Major depressive disorder, single episode, severe without psychotic features: Secondary | ICD-10-CM

## 2020-12-26 ENCOUNTER — Other Ambulatory Visit: Payer: Self-pay | Admitting: Family Medicine

## 2020-12-26 DIAGNOSIS — F411 Generalized anxiety disorder: Secondary | ICD-10-CM

## 2021-01-29 ENCOUNTER — Other Ambulatory Visit: Payer: Self-pay | Admitting: Family Medicine

## 2021-01-29 DIAGNOSIS — F411 Generalized anxiety disorder: Secondary | ICD-10-CM

## 2021-02-09 ENCOUNTER — Institutional Professional Consult (permissible substitution): Payer: Commercial Managed Care - PPO | Admitting: Pulmonary Disease

## 2021-03-02 ENCOUNTER — Telehealth: Payer: Self-pay | Admitting: Family Medicine

## 2021-03-02 DIAGNOSIS — I10 Essential (primary) hypertension: Secondary | ICD-10-CM

## 2021-03-02 NOTE — Telephone Encounter (Signed)
Pt call and stated pharmacy stated she need a new RX sent for lisinopril (ZESTRIL) 20 MG tablet to  /[facility]acy #[id number], [address] Phone:  [phone]  Fax:  [phone]

## 2021-03-03 MED ORDER — LISINOPRIL 20 MG PO TABS: 20.0000 mg | ORAL_TABLET | Freq: Every day | ORAL | 1 refills | Status: DC

## 2021-03-03 NOTE — Telephone Encounter (Signed)
Rx sent in

## 2021-04-09 ENCOUNTER — Other Ambulatory Visit: Payer: Self-pay | Admitting: Family Medicine

## 2021-04-09 DIAGNOSIS — F32A Depression, unspecified: Secondary | ICD-10-CM

## 2021-04-16 ENCOUNTER — Ambulatory Visit: Payer: Commercial Managed Care - PPO | Admitting: Family Medicine

## 2021-04-19 ENCOUNTER — Encounter: Payer: Self-pay | Admitting: Family Medicine

## 2021-04-19 ENCOUNTER — Ambulatory Visit (INDEPENDENT_AMBULATORY_CARE_PROVIDER_SITE_OTHER): Payer: Commercial Managed Care - PPO | Admitting: Family Medicine

## 2021-04-19 VITALS — BP 186/116 | HR 95 | Temp 98.9°F | Wt 208.8 lb

## 2021-04-19 DIAGNOSIS — F411 Generalized anxiety disorder: Secondary | ICD-10-CM | POA: Diagnosis not present

## 2021-04-19 DIAGNOSIS — J392 Other diseases of pharynx: Secondary | ICD-10-CM | POA: Diagnosis not present

## 2021-04-19 DIAGNOSIS — F322 Major depressive disorder, single episode, severe without psychotic features: Secondary | ICD-10-CM

## 2021-04-19 DIAGNOSIS — R238 Other skin changes: Secondary | ICD-10-CM

## 2021-04-19 DIAGNOSIS — I10 Essential (primary) hypertension: Secondary | ICD-10-CM

## 2021-04-19 MED ORDER — IRBESARTAN 75 MG PO TABS: 75.0000 mg | ORAL_TABLET | Freq: Every day | ORAL | 3 refills | Status: DC

## 2021-04-19 MED ORDER — BUPROPION HCL ER (XL) 150 MG PO TB24: 150.0000 mg | ORAL_TABLET | Freq: Every day | ORAL | 3 refills | Status: DC

## 2021-04-19 MED ORDER — HYDROXYZINE HCL 10 MG PO TABS: 5.0000 mg | ORAL_TABLET | Freq: Three times a day (TID) | ORAL | 3 refills | Status: DC | PRN

## 2021-04-19 NOTE — Progress Notes (Signed)
Subjective:  ? ? Patient ID: Katelyn Zimmerman, female    DOB: 06/20/1975, 46 y.o.   MRN: [id number] ? ?Chief Complaint  ?Patient presents with  ? Medication Refill  ?  Hydroxyzine   ? Rash  ?  Under breast  ? ? ?HPI ?Patient was seen today for f/u and med refill.  Pt states she is still having depression and anxiety.  Notes increased anxiety at work.  At times becomes tearful/has to take a minute to gather herself.  States even on her days off she has been called about work.  Patient's worked for her current company for 23 years.  Pt has not thought about counseling b/c she does not have time.  States if she is not at work she is with her 8 yo daughter.  Pt's daughter has noticed when pt becomes anxious and will try to comfort her.  Pt requesting refill on hydroxyzine 10 mg.  Started taking at night only after it made her sleepy during the day. ? ?Pt taking lisinopril 20 mg daily.  States bp 150s-160s/80s.  BP increased with increased anxiety. Denies LE edema, CP, HAs, changes in vision.  Patient never had sleep study done.  Referral was placed July 2022 however patient was not contacted until January 2023 and had forgotten about the referral.  Pt had to cancel her appointment as she was notified a few days prior to it and could not make it.   Pt mentions occasional difficulty swallowing foods, irritation on one side of her throat, and sensation of needing to clear her throat after eating.   Pt endorses h/o GERD, but does not feel like she is having overt reflux symptoms.    ? ?Pt mentions intermittent rash/irritation underneath bilateral breasts.  Rash not currently present.  Since resolution of rash pt has noticed darkened areas of skin underneath breasts.  Patient endorses some increased sweating underneath bilateral breasts. ?Past Medical History:  ?Diagnosis Date  ? Abnormal Pap smear   ? Depression   ? Fibroid   ? Hyperlipidemia   ? Hypertension   ? UTI (urinary tract infection)   ? ? ?No Known  Allergies ? ?ROS ?General: Denies fever, chills, night sweats, changes in weight, changes in appetite ?HEENT: Denies headaches, ear pain, changes in vision, rhinorrhea, sore throat  +dysphagia, throat clearing ?CV: Denies CP, palpitations, SOB, orthopnea ?Pulm: Denies SOB, cough, wheezing  ?GI: Denies abdominal pain, nausea, vomiting, diarrhea, constipation   ?GU: Denies dysuria, hematuria, frequency, vaginal discharge ?Msk: Denies muscle cramps, joint pains ?Neuro: Denies weakness, numbness, tingling ?Skin: Denies rashes, bruising ?Psych: Denies hallucinations  +anxiety, depression ?   ?Objective:  ?  ?Blood pressure (!) 186/116, pulse 95, temperature 98.9 ?F (37.2 ?C), temperature source Oral, weight 208 lb 12.8 oz (94.7 kg), SpO2 99 %, unknown if currently breastfeeding. ? ?Gen. Pleasant, well-nourished, in no distress, normal affect   ?HEENT: [place]/AT, face symmetric, conjunctiva clear, no scleral icterus, PERRLA, EOMI, nares patent without drainage ?Lungs: no accessory muscle use ?Cardiovascular: RRR, no peripheral edema ?Musculoskeletal: No deformities, no cyanosis or clubbing, normal tone ?Neuro:  A&Ox3, CN II-XII intact, normal gait ?Skin:  Warm, no lesions/ rash ? ?Wt Readings from Last 3 Encounters:  ?07/27/20 214 lb (97.1 kg)  ?07/10/19 208 lb 12.8 oz (94.7 kg)  ?12/14/17 209 lb (94.8 kg)  ? ? ?Lab Results  ?Component Value Date  ? WBC 6.3 07/16/2020  ? HGB 15.0 07/16/2020  ? HCT 46.6 (H) 07/16/2020  ? PLT 275  07/16/2020  ? GLUCOSE 83 07/16/2020  ? CHOL 264 (H) 02/10/2020  ? TRIG 171.0 (H) 02/10/2020  ? HDL 60.70 02/10/2020  ? LDLCALC 169 (H) 02/10/2020  ? ALT 16 09/30/2016  ? AST 18 09/30/2016  ? NA 137 07/16/2020  ? K 3.9 07/16/2020  ? CL 102 07/16/2020  ? CREATININE 0.97 07/16/2020  ? BUN 15 07/16/2020  ? CO2 24 07/16/2020  ? TSH 2.19 02/10/2020  ? HGBA1C 5.8 02/10/2020  ? ?GAD 7 : Generalized Anxiety Score 04/19/2021 07/27/2020 02/06/2020 02/06/2020  ?Nervous, Anxious, on Edge 3 2 0 0  ?Control/stop worrying 2  2 0 0  ?Worry too much - different things '2 2 1 1  '$ ?Trouble relaxing 2 3 0 0  ?Restless 2 2 0 0  ?Easily annoyed or irritable 3 2 0 0  ?Afraid - awful might happen 1 3 0 0  ?Total GAD 7 Score '15 16 1 1  '$ ?Anxiety Difficulty Very difficult Very difficult Not difficult at all Not difficult at all  ? ? ?Depression screen [facility] 2/9 04/19/2021 07/27/2020 02/06/2020  ?Decreased Interest 3 3 0  ?Down, Depressed, Hopeless '2 3 1  '$ ?PHQ - 2 Score '5 6 1  '$ ?Altered sleeping 3 3 -  ?Tired, decreased energy 3 3 -  ?Change in appetite 3 3 -  ?Feeling bad or failure about yourself  3 2 -  ?Trouble concentrating 3 - -  ?Moving slowly or fidgety/restless 3 3 -  ?Suicidal thoughts 0 0 -  ?PHQ-9 Score 23 20 -  ?Difficult doing work/chores Very difficult Somewhat difficult -  ?Some recent data might be hidden  ? ? ? ?Assessment/Plan: ? On day of service, 35 minutes spent caring for this patient face-to-face, reviewing the chart, counseling and/or coordinating care for plan and treatment of diagnosis below.   ? ?Essential hypertension  ?-uncontrolled ?-Discontinue lisinopril 20 mg daily given possible throat irritation/need to clear her throat.  GERD may also be contributing to throat irritation. ?-We will start irbesartan 75 mg daily ?-Lifestyle modifications encouraged ?-Patient to check BP at home and bring the log to clinic in 1 month ?-Patient encouraged to call sleep medicine back to reschedule sleep study. ?- Plan: irbesartan (AVAPRO) 75 MG tablet ? ?GAD (generalized anxiety disorder)  ?-GAD-7 score 15 this visit ?-Patient advised to consider counseling/with psychiatry.  Seems open to the idea.  Given information on area [facility] providers and encouraged to schedule appointment.  Can also look into EAP. ?-Continue Wellbutrin XL 150 mg daily ?-Continue hydroxyzine 10 mg as needed.  Patient can try taking half a tab (5 mg) during the day which may decrease drowsiness. ?- Plan: buPROPion (WELLBUTRIN XL) 150 MG 24 hr tablet, hydrOXYzine (ATARAX) 10  MG tablet ? ?Depression, major, single episode, severe ([place])  ?-PHQ-9 score 23 ?-Patient encouraged to schedule appointment for counseling/with psychiatry.  Given info on area providers ?-Continue Wellbutrin XL 150 mg daily ?-Given strict precautions ?- Plan: buPROPion (WELLBUTRIN XL) 150 MG 24 hr tablet ? ?Throat irritation ?-Discussed possible causes including medication versus silent reflux/GERD ?-We will stop lisinopril 20 mg daily to see if symptoms resolve. ?-For continued symptoms despite change in BP meds consider PPI. ? ?Skin irritation ?-Currently resolved ?-Likely 2/2 candida skin infection from increased sweating underneath bilateral breasts. ?-Discussed using Gold[employer] powder, moisture wicking fabrics, and drying off thoroughly after showering to help decrease moisture underneath bilateral breasts. ?-Hyperpigmentation will improve with time ? ?F/u in 1 month ? ?Grier Mitts, MD ?

## 2021-04-19 NOTE — Patient Instructions (Addendum)
Behavioral Health Services: ?-to make an appointment contact the office/provider you are interested in seeing.  No referral is needed.  The below is not an all inclusive list, but will help you get started. ? ?[email or web address] ?-counseling located off of [employer] [address]. ? ?[email or web address] ?-website helps you find providers in your area ? ?[facility] ?-Located off of [address]. across from [employer] ? ?Dr. Darleene Cleaver is a Psychiatrist with [facility]. [phone] ? ?[facility]  ?-[place]  [phone] ?-a place in town that has counseling and Psychiatry services.   ? ? ?Stop taking lisinopril 20 mg daily.  New prescription for irbesartan was sent to your pharmacy.  This is a medication for your blood pressure. ? ?We will have you follow-up in clinic in the next 4-6 weeks. ? ?

## 2021-05-11 ENCOUNTER — Other Ambulatory Visit: Payer: Self-pay | Admitting: Family Medicine

## 2021-05-11 DIAGNOSIS — F411 Generalized anxiety disorder: Secondary | ICD-10-CM

## 2021-05-28 ENCOUNTER — Encounter: Payer: Self-pay | Admitting: Family Medicine

## 2021-05-28 ENCOUNTER — Ambulatory Visit: Payer: Commercial Managed Care - PPO | Admitting: Family Medicine

## 2021-05-28 ENCOUNTER — Telehealth (INDEPENDENT_AMBULATORY_CARE_PROVIDER_SITE_OTHER): Payer: Commercial Managed Care - PPO | Admitting: Family Medicine

## 2021-05-28 DIAGNOSIS — R002 Palpitations: Secondary | ICD-10-CM | POA: Diagnosis not present

## 2021-05-28 DIAGNOSIS — F322 Major depressive disorder, single episode, severe without psychotic features: Secondary | ICD-10-CM

## 2021-05-28 DIAGNOSIS — F411 Generalized anxiety disorder: Secondary | ICD-10-CM

## 2021-05-28 DIAGNOSIS — I10 Essential (primary) hypertension: Secondary | ICD-10-CM

## 2021-05-28 MED ORDER — BUPROPION HCL ER (XL) 300 MG PO TB24: 300.0000 mg | ORAL_TABLET | Freq: Every day | ORAL | 3 refills | Status: DC

## 2021-05-28 NOTE — Patient Instructions (Signed)
Behavioral Health Services: -to make an appointment contact the office/provider you are interested in seeing.  No referral is needed.  The below is not an all inclusive list, but will help you get started.  [email or web address] -counseling located off of [address]  [email or web address] -website helps you find providers in your area  [facility] -Located off of [address] across from [employer]  Dr. Akintayo is a Psychiatrist with [place]. [phone]  [facility]  [facility]  -[address]  [phone] -a place in town that has counseling and Psychiatry services.    

## 2021-05-28 NOTE — Progress Notes (Signed)
Virtual Visit via Video Note ? ?I connected withN Katelyn Zimmerman on 05/28/21 at  4:00 PM EDT by a video enabled telemedicine application 2/2 IOEVO-[id number] pandemic and verified that I am speaking with the correct person using two identifiers. ? Location patient: home ?Location provider:work or home office ?Persons participating in the virtual visit: patient, provider ? ?I discussed the limitations of evaluation and management by telemedicine and the availability of in person appointments. The patient expressed understanding and agreed to proceed. ? ? ?HPI: ?Pt seen for f/u.  Cancelled in office appt today as felt she would not make it to clinic in time.   Pt not really checking bp.  On irbesartan 75 mg daily. ? ?Pt endorses palpations in the last 2 wks.  When first started was intermittent.  Constant for the last 1 wk.  Feels like heart is skipping beats and at times is a thud.  Denies HAs and dizziness. ? ?Notices increase depression/anxiety.  Taking wellbutrin XL 150 mg.  Typically takes med in a.m. but took pill late this afternoon.  Patient has not looked into EAP for counseling/psychiatry options.  Patient denies SI.  Endorses HI.  Feels like mood changes are becoming more noticeable at work.  Hydroxyzine caused drowsiness. ? ?ROS: See pertinent positives and negatives per HPI. ? ?Past Medical History:  ?Diagnosis Date  ? Abnormal Pap smear   ? Depression   ? Fibroid   ? Hyperlipidemia   ? Hypertension   ? UTI (urinary tract infection)   ? ? ?Past Surgical History:  ?Procedure Laterality Date  ? CHOLECYSTECTOMY    ? CHOLECYSTECTOMY Bilateral 2001  ? NO PAST SURGERIES    ? ? ?Family History  ?Problem Relation Age of Onset  ? Arthritis Mother   ? Hypertension Mother   ? Hypertension Father   ? Diabetes Father   ? ? ? ?Current Outpatient Medications:  ?  buPROPion (WELLBUTRIN XL) 150 MG 24 hr tablet, Take 1 tablet (150 mg total) by mouth daily., Disp: 30 tablet, Rfl: 3 ?  hydrOXYzine (ATARAX) 10 MG tablet, TAKE 0.5  TABLETS (5 MG TOTAL) BY MOUTH 3 (THREE) TIMES DAILY AS NEEDED., Disp: 135 tablet, Rfl: 0 ?  irbesartan (AVAPRO) 75 MG tablet, Take 1 tablet (75 mg total) by mouth daily., Disp: 30 tablet, Rfl: 3 ? ?EXAM: ? ?VITALS per patient if applicable: RR between 00-93 bpm ? ?GENERAL: alert, oriented, well-groomed, depressed affect, in no acute distress ? ?HEENT: atraumatic, conjunctiva clear, no obvious abnormalities on inspection of external nose and ears ? ?NECK: normal movements of the head and neck ? ?LUNGS: on inspection no signs of respiratory distress, breathing rate appears normal, no obvious gross SOB, gasping or wheezing ? ?CV: no obvious cyanosis ? ?MS: moves all visible extremities without noticeable abnormality ? ?PSYCH/NEURO: pleasant and cooperative, no obvious depression or anxiety, speech and thought processing grossly intact ? ?ASSESSMENT AND PLAN: ? ?Discussed the following assessment and plan: ? ?GAD (generalized anxiety disorder)  ?- Plan: buPROPion (WELLBUTRIN XL) 300 MG 24 hr tablet ? ?Depression, major, single episode, severe ([place])  ?- Plan: buPROPion (WELLBUTRIN XL) 300 MG 24 hr tablet ? ?Palpitations ?-Likely 2/2 increased anxiety ? ?Essential hypertension ?-Continue irbesartan 75 mg daily ?-Lifestyle modifications ?-Encouraged to check BP and keep a log to bring to clinic ? ?Given increased anxiety/depression symptoms patient strongly advised to make an appointment with psychiatry for counseling and medication management.  Discussed increasing Wellbutrin XL from 150 to 300 mg daily.  Patient given  strict ED precautions for continued or worsening symptoms given reported HI. ? ?Patient advised on need for in person evaluation given palpitations and history of HTN.  Follow-up in 2 weeks, sooner if needed. ?  ?I discussed the assessment and treatment plan with the patient. The patient was provided an opportunity to ask questions and all were answered. The patient agreed with the plan and demonstrated an  understanding of the instructions. ?  ?The patient was advised to call back or seek an in-person evaluation if the symptoms worsen or if the condition fails to improve as anticipated. ? ? ?[name], MD  ? ?

## 2021-06-11 ENCOUNTER — Other Ambulatory Visit: Payer: Self-pay | Admitting: Family Medicine

## 2021-06-11 DIAGNOSIS — F411 Generalized anxiety disorder: Secondary | ICD-10-CM

## 2021-06-11 DIAGNOSIS — F322 Major depressive disorder, single episode, severe without psychotic features: Secondary | ICD-10-CM

## 2021-07-31 ENCOUNTER — Other Ambulatory Visit: Payer: Self-pay | Admitting: Family Medicine

## 2021-07-31 DIAGNOSIS — I10 Essential (primary) hypertension: Secondary | ICD-10-CM

## 2021-11-04 ENCOUNTER — Telehealth: Payer: Self-pay | Admitting: Family Medicine

## 2021-11-04 DIAGNOSIS — F411 Generalized anxiety disorder: Secondary | ICD-10-CM

## 2021-11-04 DIAGNOSIS — F322 Major depressive disorder, single episode, severe without psychotic features: Secondary | ICD-10-CM

## 2021-11-04 MED ORDER — BUPROPION HCL ER (XL) 300 MG PO TB24: 300.0000 mg | ORAL_TABLET | Freq: Every day | ORAL | 0 refills | Status: DC

## 2021-11-04 NOTE — Telephone Encounter (Signed)
Refill buPROPion (WELLBUTRIN XL) 300 MG 24 hr tablet, irbesartan (AVAPRO) 75 MG tablet

## 2021-11-04 NOTE — Telephone Encounter (Signed)
Last visit was virtual visit on 05/28/21; last OV 04/19/21 BP elevated. Will fill prescription for 1 month; pt needs in office appt.

## 2021-11-12 ENCOUNTER — Encounter: Payer: Self-pay | Admitting: Adult Health

## 2021-11-12 ENCOUNTER — Ambulatory Visit (INDEPENDENT_AMBULATORY_CARE_PROVIDER_SITE_OTHER): Payer: Commercial Managed Care - PPO | Admitting: Adult Health

## 2021-11-12 VITALS — BP 128/80 | HR 82 | Temp 98.2°F | Ht 66.0 in | Wt 212.0 lb

## 2021-11-12 DIAGNOSIS — F411 Generalized anxiety disorder: Secondary | ICD-10-CM

## 2021-11-12 DIAGNOSIS — R002 Palpitations: Secondary | ICD-10-CM

## 2021-11-12 DIAGNOSIS — Z23 Encounter for immunization: Secondary | ICD-10-CM

## 2021-11-12 NOTE — Progress Notes (Signed)
Subjective:    Patient ID: Katelyn Zimmerman, female    DOB: 30-Jan-1976, 46 y.o.   MRN: [id number]  HPI  46 year old female who  has a past medical history of Abnormal Pap smear, Depression, Fibroid, Hyperlipidemia, Hypertension, and UTI (urinary tract infection).  She is a patient of Dr. Volanda Napoleon who I am seeing today for a non acute issue.  She has a history of generalized anxiety disorder, was last seen by her PCP via video visit on 05/28/2021.  At this time she endorsed palpitations for at least 2 weeks.  Felt as though her heart was skipping a beat at times.  She was taking Wellbutrin 150 mg daily  which was increased to 300 mg and using Atarax as needed.  She reports that she continued to have palpitations so she stopped her medications cold [place] about 2 months ago because she thought the medications may have been contributing to the palpitations.  Today she reports that her anxiety is worse and she continues to have palpitations.  She has not restarted her Wellbutrin 300 mg daily that was sent in to the pharmacy 7 days ago.  She is wanting something that she can take in the afternoon for her anxiety that will not make her sleepy.  Review of Systems See HPI   Past Medical History:  Diagnosis Date   Abnormal Pap smear    Depression    Fibroid    Hyperlipidemia    Hypertension    UTI (urinary tract infection)     Social History   Socioeconomic History   Marital status: Single    Spouse name: Not on file   Number of children: Not on file   Years of education: Not on file   Highest education level: Some college, no degree  Occupational History   Not on file  Tobacco Use   Smoking status: Every Day    Packs/day: 0.25    Years: 20.00    Total pack years: 5.00    Types: Cigarettes   Smokeless tobacco: Never  Vaping Use   Vaping Use: Never used  Substance and Sexual Activity   Alcohol use: Yes    Alcohol/week: 2.0 standard drinks of alcohol    Types: 2 Shots of  liquor per week    Comment: occasionally   Drug use: No   Sexual activity: Yes    Birth control/protection: I.U.D.  Other Topics Concern   Not on file  Social History Narrative   Not on file   Social Determinants of Health   Financial Resource Strain: Medium Risk (04/18/2021)   Overall Financial Resource Strain (CARDIA)    Difficulty of Paying Living Expenses: Somewhat hard  Food Insecurity: Food Insecurity Present (04/18/2021)   Hunger Vital Sign    Worried About Running Out of Food in the Last Year: Often true    Ran Out of Food in the Last Year: Sometimes true  Transportation Needs: No Transportation Needs (04/18/2021)   PRAPARE - Hydrologist (Medical): No    Lack of Transportation (Non-Medical): No  Physical Activity: Unknown (04/18/2021)   Exercise Vital Sign    Days of Exercise per Week: 0 days    Minutes of Exercise per Session: Not on file  Stress: Stress Concern Present (04/18/2021)   [place]    Feeling of Stress : Very much  Social Connections: Socially Isolated (04/18/2021)   Social Connection and Isolation Panel [  NHANES]    Frequency of Communication with Friends and Family: More than three times a week    Frequency of Social Gatherings with Friends and Family: Never    Attends Religious Services: Never    Marine scientist or Organizations: No    Attends Music therapist: Not on file    Marital Status: Divorced  Human resources officer Violence: Not on file    Past Surgical History:  Procedure Laterality Date   CHOLECYSTECTOMY     CHOLECYSTECTOMY Bilateral 2001   NO PAST SURGERIES      Family History  Problem Relation Age of Onset   Arthritis Mother    Hypertension Mother    Hypertension Father    Diabetes Father     No Known Allergies  Current Outpatient Medications on File Prior to Visit  Medication Sig Dispense Refill   buPROPion  (WELLBUTRIN XL) 300 MG 24 hr tablet Take 1 tablet (300 mg total) by mouth daily. SCHEDULE APPT FOR FUTURE REFILLS. 30 tablet 0   hydrOXYzine (ATARAX) 10 MG tablet TAKE 0.5 TABLETS (5 MG TOTAL) BY MOUTH 3 (THREE) TIMES DAILY AS NEEDED. 135 tablet 0   irbesartan (AVAPRO) 75 MG tablet TAKE 1 TABLET BY MOUTH EVERY DAY 90 tablet 0   No current facility-administered medications on file prior to visit.    BP 128/80   Pulse 82   Temp 98.2 F (36.8 C) (Oral)   Ht '5\' 6"'$  (1.676 m)   Wt 212 lb (96.2 kg)   SpO2 98%   BMI 34.22 kg/m       Objective:   Physical Exam Vitals and nursing note reviewed.  Constitutional:      Appearance: Normal appearance.  Cardiovascular:     Rate and Rhythm: Normal rate and regular rhythm.     Pulses: Normal pulses.     Heart sounds: Normal heart sounds.  Pulmonary:     Effort: Pulmonary effort is normal.     Breath sounds: Normal breath sounds.  Musculoskeletal:        General: Normal range of motion.  Skin:    General: Skin is warm and dry.  Neurological:     General: No focal deficit present.     Mental Status: She is alert and oriented to person, place, and time.  Psychiatric:        Mood and Affect: Mood normal.        Behavior: Behavior normal.        Thought Content: Thought content normal.        Judgment: Judgment normal.       Assessment & Plan:  1. Palpitations  - EKG 12-Lead- NSR, Rate 69   2. GAD (generalized anxiety disorder) -Encouraged to restart her Wellbutrin 300 mg extended release daily.  Explained to her that unfortunately I could not change her start her on any new anxiety medications per office policy.  She will need to follow-up with her PCP for further management.  Dorothyann Peng, NP   Time spent with patient today was 32 minutes which consisted of chart review, discussing diagnosis, work up, treatment answering questions and documentation.

## 2021-11-12 NOTE — Patient Instructions (Signed)
Your EKG was normal.   Please restart your wellbutrin   Follow up with Dr. Volanda Napoleon next week

## 2021-11-15 ENCOUNTER — Telehealth (INDEPENDENT_AMBULATORY_CARE_PROVIDER_SITE_OTHER): Payer: Commercial Managed Care - PPO | Admitting: Family Medicine

## 2021-12-01 NOTE — Progress Notes (Signed)
Erroneous encounter

## 2022-02-18 ENCOUNTER — Encounter: Payer: Commercial Managed Care - PPO | Admitting: Family Medicine

## 2022-04-27 ENCOUNTER — Encounter: Payer: Self-pay | Admitting: Family Medicine

## 2022-04-27 ENCOUNTER — Ambulatory Visit (INDEPENDENT_AMBULATORY_CARE_PROVIDER_SITE_OTHER): Payer: Commercial Managed Care - PPO | Admitting: Family Medicine

## 2022-04-27 ENCOUNTER — Other Ambulatory Visit (INDEPENDENT_AMBULATORY_CARE_PROVIDER_SITE_OTHER): Payer: Commercial Managed Care - PPO

## 2022-04-27 VITALS — BP 190/100 | HR 67 | Temp 98.4°F | Ht 66.0 in | Wt 215.6 lb

## 2022-04-27 DIAGNOSIS — F411 Generalized anxiety disorder: Secondary | ICD-10-CM

## 2022-04-27 DIAGNOSIS — F322 Major depressive disorder, single episode, severe without psychotic features: Secondary | ICD-10-CM

## 2022-04-27 DIAGNOSIS — F321 Major depressive disorder, single episode, moderate: Secondary | ICD-10-CM

## 2022-04-27 DIAGNOSIS — R002 Palpitations: Secondary | ICD-10-CM

## 2022-04-27 DIAGNOSIS — I10 Essential (primary) hypertension: Secondary | ICD-10-CM

## 2022-04-27 DIAGNOSIS — R0683 Snoring: Secondary | ICD-10-CM

## 2022-04-27 DIAGNOSIS — Z91199 Patient's noncompliance with other medical treatment and regimen due to unspecified reason: Secondary | ICD-10-CM

## 2022-04-27 LAB — CBC WITH DIFFERENTIAL/PLATELET
Basophils Absolute: 0.1 10*3/uL (ref 0.0–0.1)
Basophils Relative: 1.2 % (ref 0.0–3.0)
Eosinophils Absolute: 0.4 10*3/uL (ref 0.0–0.7)
Eosinophils Relative: 5.6 % — ABNORMAL HIGH (ref 0.0–5.0)
HCT: 45 % (ref 36.0–46.0)
Hemoglobin: 15.1 g/dL — ABNORMAL HIGH (ref 12.0–15.0)
Lymphocytes Relative: 40.1 % (ref 12.0–46.0)
Lymphs Abs: 2.8 10*3/uL (ref 0.7–4.0)
MCHC: 33.5 g/dL (ref 30.0–36.0)
MCV: 87 fl (ref 78.0–100.0)
Monocytes Absolute: 0.6 10*3/uL (ref 0.1–1.0)
Monocytes Relative: 8.7 % (ref 3.0–12.0)
Neutro Abs: 3 10*3/uL (ref 1.4–7.7)
Neutrophils Relative %: 44.4 % (ref 43.0–77.0)
Platelets: 272 10*3/uL (ref 150.0–400.0)
RBC: 5.18 Mil/uL — ABNORMAL HIGH (ref 3.87–5.11)
RDW: 14.4 % (ref 11.5–15.5)
WBC: 6.9 10*3/uL (ref 4.0–10.5)

## 2022-04-27 LAB — HEMOGLOBIN A1C: Hgb A1c MFr Bld: 5.8 % (ref 4.6–6.5)

## 2022-04-27 MED ORDER — IRBESARTAN 150 MG PO TABS: 150.0000 mg | ORAL_TABLET | Freq: Every day | ORAL | 4 refills | Status: DC

## 2022-04-27 NOTE — Progress Notes (Signed)
Established Patient Office Visit   Subjective  Patient ID: Katelyn Zimmerman, female    DOB: 02-18-1975  Age: 47 y.o. MRN: [id number]  Chief Complaint  Patient presents with   Labs Only   Medication Consultation  Patient accompanied by her daughter.  Pt is a 47 yo female with pmh sig for HTN, anxiety, depression who presents for f/u.  Has decreased motivation, sleeping more, and mood is variable.  Patient taking Wellbutrin XL 300 mg intermittently.  States mind racing going different places that she is just sitting there.  Patient did not look into EAP or other [place] services due to time and cost.  Patient notes BP elevated 170/100, 168/90 at home.  Having headaches and posterior head.  Patient's daughter states she is eating more salt.  Patient endorses palpitations.  Wants labs to evaluate.  Seen in October where EKG was negative.  At that time patient stopped Wellbutrin pt findings causing the symptoms.  Hydroxyzine caused drowsiness.  Previously on Zoloft 100 mg.  Patient drinking 1-2 cups of coffee per day.  Endorses drinking alcohol or smoking to manage symptoms.   Past Medical History:  Diagnosis Date   Abnormal Pap smear    Depression    Fibroid    Hyperlipidemia    Hypertension    UTI (urinary tract infection)    Social History   Socioeconomic History   Marital status: Single    Spouse name: Not on file   Number of children: Not on file   Years of education: Not on file   Highest education level: Some college, no degree  Occupational History   Not on file  Tobacco Use   Smoking status: Every Day    Packs/day: 0.25    Years: 20.00    Additional pack years: 0.00    Total pack years: 5.00    Types: Cigarettes   Smokeless tobacco: Never  Vaping Use   Vaping Use: Never used  Substance and Sexual Activity   Alcohol use: Yes    Alcohol/week: 2.0 standard drinks of alcohol    Types: 2 Shots of liquor per week    Comment: occasionally   Drug use: No   Sexual  activity: Yes    Birth control/protection: I.U.D.  Other Topics Concern   Not on file  Social History Narrative   Not on file   Social Determinants of Health   Financial Resource Strain: Medium Risk (04/18/2021)   Overall Financial Resource Strain (CARDIA)    Difficulty of Paying Living Expenses: Somewhat hard  Food Insecurity: Food Insecurity Present (04/18/2021)   Hunger Vital Sign    Worried About Running Out of Food in the Last Year: Often true    Ran Out of Food in the Last Year: Sometimes true  Transportation Needs: No Transportation Needs (04/18/2021)   PRAPARE - Hydrologist (Medical): No    Lack of Transportation (Non-Medical): No  Physical Activity: Unknown (04/18/2021)   Exercise Vital Sign    Days of Exercise per Week: 0 days    Minutes of Exercise per Session: Not on file  Stress: Stress Concern Present (04/18/2021)   [place]    Feeling of Stress : Very much  Social Connections: Socially Isolated (04/18/2021)   Social Connection and Isolation Panel [NHANES]    Frequency of Communication with Friends and Family: More than three times a week    Frequency of Social Gatherings with Friends  and Family: Never    Attends Religious Services: Never    Marine scientist or Organizations: No    Attends Music therapist: Not on file    Marital Status: Divorced  Human resources officer Violence: Not on file   Family History  Problem Relation Age of Onset   Arthritis Mother    Hypertension Mother    Hypertension Father    Diabetes Father    No Known Allergies    ROS Negative unless stated above    Objective:     BP (!) 190/100 (BP Location: Left Arm, Patient Position: Sitting, Cuff Size: Large)   Pulse 67   Temp 98.4 F (36.9 C) (Oral)   Ht 5\' 6"  (1.676 m)   Wt 215 lb 9.6 oz (97.8 kg)   SpO2 99%   BMI 34.80 kg/m  BP Readings from Last 3 Encounters:   04/27/22 (!) 190/100  11/12/21 128/80  04/19/21 (!) 186/116   Wt Readings from Last 3 Encounters:  04/27/22 215 lb 9.6 oz (97.8 kg)  11/12/21 212 lb (96.2 kg)  04/19/21 208 lb 12.8 oz (94.7 kg)      Physical Exam Constitutional:      General: She is not in acute distress.    Appearance: Normal appearance.  HENT:     Head: Normocephalic and atraumatic.     Nose: Nose normal.     Mouth/Throat:     Mouth: Mucous membranes are moist.  Eyes:     Extraocular Movements: Extraocular movements intact.     Conjunctiva/sclera: Conjunctivae normal.  Cardiovascular:     Rate and Rhythm: Normal rate and regular rhythm.     Heart sounds: Normal heart sounds. No murmur heard.    No gallop.  Pulmonary:     Effort: Pulmonary effort is normal. No respiratory distress.     Breath sounds: Normal breath sounds. No wheezing, rhonchi or rales.  Skin:    General: Skin is warm and dry.  Neurological:     Mental Status: She is alert and oriented to person, place, and time.       04/27/2022    4:24 PM 11/12/2021    3:58 PM 04/19/2021   12:51 PM  Depression screen PHQ 2/9  Decreased Interest 3 3 3   Down, Depressed, Hopeless 1 3 2   PHQ - 2 Score 4 6 5   Altered sleeping 2 3 3   Tired, decreased energy 3 3 3   Change in appetite 2 3 3   Feeling bad or failure about yourself  1 3 3   Trouble concentrating 2 3 3   Moving slowly or fidgety/restless 1 2 3   Suicidal thoughts 0 0 0  PHQ-9 Score 15 23 23   Difficult doing work/chores Somewhat difficult Very difficult Very difficult      04/19/2021   12:51 PM 07/27/2020    4:42 PM 02/06/2020    3:43 PM 02/06/2020    3:12 PM  GAD 7 : Generalized Anxiety Score  Nervous, Anxious, on Edge 3 2 0 0  Control/stop worrying 2 2 0 0  Worry too much - different things 2 2 1 1   Trouble relaxing 2 3 0 0  Restless 2 2 0 0  Easily annoyed or irritable 3 2 0 0  Afraid - awful might happen 1 3 0 0  Total GAD 7 Score 15 16 1 1   Anxiety Difficulty Very difficult Very  difficult Not difficult at all Not difficult at all      No results found  for any visits on 04/27/22.    Assessment & Plan:  Essential hypertension -Uncontrolled.  Previously controlled. -Increase irbesartan from 75 mg to 150 mg daily. -Lifestyle modifications -     Ambulatory referral to Cardiology -     Ambulatory referral to Pulmonology -     Irbesartan; Take 1 tablet (150 mg total) by mouth daily.  Dispense: 30 tablet; Refill: 4 -     Comprehensive metabolic panel; Future -     TSH; Future -     T4, free; Future  GAD (generalized anxiety disorder) -GAD-7 score 15 this visit -     TSH; Future -     T4, free; Future  Depression, major, single episode, moderate (HCC) -PHQ-9 score 15 this visit -     VITAMIN D 25 Hydroxy (Vit-D Deficiency, Fractures); Future -     TSH; Future -     T4, free; Future  Palpitations -Discussed limiting caffeine intake. -     Ambulatory referral to Cardiology -     Ambulatory referral to Pulmonology -     Hemoglobin A1c; Future -     CBC with Differential/Platelet; Future -     Comprehensive metabolic panel; Future -     TSH; Future -     T4, free; Future  Snoring -     Ambulatory referral to Cardiology -     Ambulatory referral to Pulmonology  Non-compliance   BP uncontrolled.  Recheck.  OSA may be contributing to elevated BP and decreased energy.  Discussed the importance of lifestyle modifications including decreasing sodium intake and controlling anxiety/stress.  Increase irbesartan from 75 mg daily to 150 mg daily.  PHQ-9 score 15 and GAD 7 score 15 this visit, both improving.  Discussed the importance of BH meds consistently.  Given patient's continued symptoms despite several medications advised to schedule follow-up with psychiatry.  Again advised to consider EAP as services are free.  Will obtain labs.  Referral to cardiology placed for palpitations.  Referral to pulmonology placed for sleep study.   Return in about 4 weeks  (around 05/25/2022).   [name], MD

## 2022-04-27 NOTE — Patient Instructions (Addendum)
Orders for labs were placed.  Given your elevated blood pressure and increased dose of irbesartan was sent to your pharmacy.  The new dose is 150 mg.  If you have extra 75 mg tabs of irbesartan at home you can take 2 of these until you are ready to pick up the new prescription.  We will have you follow-up in 1 month to see how your blood pressure is doing.  A referral to cardiology and to pulmonology (for sleep study) were both placed.  You should expect a phone call about scheduling these appointments.  Looking to counseling/behavioral health options with your insurance company.  If you are interested in trying a different medication other than Wellbutrin this is an option.

## 2022-04-28 LAB — COMPREHENSIVE METABOLIC PANEL
ALT: 20 U/L (ref 0–35)
AST: 19 U/L (ref 0–37)
Albumin: 4.2 g/dL (ref 3.5–5.2)
Alkaline Phosphatase: 85 U/L (ref 39–117)
BUN: 17 mg/dL (ref 6–23)
CO2: 21 mEq/L (ref 19–32)
Calcium: 9.4 mg/dL (ref 8.4–10.5)
Chloride: 102 mEq/L (ref 96–112)
Creatinine, Ser: 1.21 mg/dL — ABNORMAL HIGH (ref 0.40–1.20)
GFR: 53.81 mL/min — ABNORMAL LOW (ref >60.00)
Glucose, Bld: 90 mg/dL (ref 70–99)
Potassium: 3.8 mEq/L (ref 3.5–5.1)
Sodium: 135 mEq/L (ref 135–145)
Total Bilirubin: 0.4 mg/dL (ref 0.2–1.2)
Total Protein: 7.5 g/dL (ref 6.0–8.3)

## 2022-04-28 LAB — VITAMIN D 25 HYDROXY (VIT D DEFICIENCY, FRACTURES): VITD: 20.54 ng/mL — ABNORMAL LOW (ref 30.00–100.00)

## 2022-04-28 LAB — T4, FREE: Free T4: 0.92 ng/dL (ref 0.60–1.60)

## 2022-04-28 LAB — TSH: TSH: 2.64 u[IU]/mL (ref 0.35–5.50)

## 2022-05-02 ENCOUNTER — Telehealth: Payer: Self-pay | Admitting: Family Medicine

## 2022-05-02 NOTE — Telephone Encounter (Signed)
Pt called, requesting a call back to go over her lab results.  LOV:  04/27/22

## 2022-05-04 NOTE — Telephone Encounter (Signed)
Spoke to pt. Pt reports she has concerns on her lab.  inform pt, provider has not review it yet. Once she has review it and noted on the result, we would give her a call.   Pt ask how long will it takes. Inform pt, a message will relay to provider. Verbalized understanding.

## 2022-05-05 ENCOUNTER — Other Ambulatory Visit: Payer: Self-pay | Admitting: *Deleted

## 2022-05-05 ENCOUNTER — Other Ambulatory Visit: Payer: Self-pay | Admitting: Family Medicine

## 2022-05-05 DIAGNOSIS — R7989 Other specified abnormal findings of blood chemistry: Secondary | ICD-10-CM

## 2022-05-05 DIAGNOSIS — E559 Vitamin D deficiency, unspecified: Secondary | ICD-10-CM

## 2022-05-05 DIAGNOSIS — D751 Secondary polycythemia: Secondary | ICD-10-CM

## 2022-05-05 MED ORDER — VITAMIN D (ERGOCALCIFEROL) 1.25 MG (50000 UNIT) PO CAPS: 50000.0000 [IU] | ORAL_CAPSULE | ORAL | 0 refills | Status: DC

## 2022-05-09 NOTE — Telephone Encounter (Signed)
Lab note was sent last week.

## 2022-05-13 ENCOUNTER — Other Ambulatory Visit: Payer: Commercial Managed Care - PPO

## 2022-05-13 DIAGNOSIS — R7989 Other specified abnormal findings of blood chemistry: Secondary | ICD-10-CM

## 2022-05-13 DIAGNOSIS — D751 Secondary polycythemia: Secondary | ICD-10-CM

## 2022-05-13 DIAGNOSIS — E559 Vitamin D deficiency, unspecified: Secondary | ICD-10-CM

## 2022-05-13 NOTE — Addendum Note (Signed)
Addended by: Donald Pore A on: 05/13/2022 03:44 PM   Modules accepted: Orders

## 2022-05-13 NOTE — Addendum Note (Signed)
Addended by: MAZARIEGOS-SANCHEZ, Alonza Knisley A on: 05/13/2022 03:43 PM   Modules accepted: Orders  

## 2022-05-13 NOTE — Addendum Note (Signed)
Addended by: Donald Pore A on: 05/13/2022 03:43 PM   Modules accepted: Orders

## 2022-05-14 LAB — COMPLETE METABOLIC PANEL WITH GFR
AG Ratio: 1.5 (calc) (ref 1.0–2.5)
ALT: 16 U/L (ref 6–29)
AST: 15 U/L (ref 10–35)
Albumin: 4.2 g/dL (ref 3.6–5.1)
Alkaline phosphatase (APISO): 83 U/L (ref 31–125)
BUN/Creatinine Ratio: 14 (calc) (ref 6–22)
BUN: 17 mg/dL (ref 7–25)
CO2: 24 mmol/L (ref 20–32)
Calcium: 9.5 mg/dL (ref 8.6–10.2)
Chloride: 104 mmol/L (ref 98–110)
Creat: 1.23 mg/dL — ABNORMAL HIGH (ref 0.50–0.99)
Globulin: 2.8 g/dL (calc) (ref 1.9–3.7)
Glucose, Bld: 83 mg/dL (ref 65–99)
Potassium: 3.7 mmol/L (ref 3.5–5.3)
Sodium: 137 mmol/L (ref 135–146)
Total Bilirubin: 0.3 mg/dL (ref 0.2–1.2)
Total Protein: 7 g/dL (ref 6.1–8.1)
eGFR: 55 mL/min/{1.73_m2} — ABNORMAL LOW (ref >=60)

## 2022-05-14 LAB — CBC WITH DIFFERENTIAL/PLATELET
Absolute Monocytes: 488 cells/uL (ref 200–950)
Basophils Absolute: 49 cells/uL (ref 0–200)
Basophils Relative: 0.8 %
Eosinophils Absolute: 189 cells/uL (ref 15–500)
Eosinophils Relative: 3.1 %
HCT: 44.2 % (ref 35.0–45.0)
Hemoglobin: 14.8 g/dL (ref 11.7–15.5)
Lymphs Abs: 2208 cells/uL (ref 850–3900)
MCH: 29 pg (ref 27.0–33.0)
MCHC: 33.5 g/dL (ref 32.0–36.0)
MCV: 86.5 fL (ref 80.0–100.0)
MPV: 11.7 fL (ref 7.5–12.5)
Monocytes Relative: 8 %
Neutro Abs: 3166 cells/uL (ref 1500–7800)
Neutrophils Relative %: 51.9 %
Platelets: 259 10*3/uL (ref 140–400)
RBC: 5.11 10*6/uL — ABNORMAL HIGH (ref 3.80–5.10)
RDW: 13.1 % (ref 11.0–15.0)
Total Lymphocyte: 36.2 %
WBC: 6.1 10*3/uL (ref 3.8–10.8)

## 2022-05-14 LAB — IRON,TIBC AND FERRITIN PANEL
%SAT: 19 % (calc) (ref 16–45)
Ferritin: 74 ng/mL (ref 16–232)
Iron: 65 ug/dL (ref 40–190)
TIBC: 351 mcg/dL (calc) (ref 250–450)

## 2022-05-14 LAB — VITAMIN D 25 HYDROXY (VIT D DEFICIENCY, FRACTURES): Vit D, 25-Hydroxy: 24 ng/mL — ABNORMAL LOW (ref 30–100)

## 2022-05-19 ENCOUNTER — Telehealth: Payer: Self-pay | Admitting: Family Medicine

## 2022-05-19 NOTE — Telephone Encounter (Signed)
Results not yet viewed by PCP 

## 2022-05-19 NOTE — Telephone Encounter (Signed)
Pt is calling and has viewed her blood work on [employer] however she has some concerning about the results

## 2022-05-25 ENCOUNTER — Other Ambulatory Visit: Payer: Self-pay | Admitting: Family Medicine

## 2022-05-25 DIAGNOSIS — E559 Vitamin D deficiency, unspecified: Secondary | ICD-10-CM

## 2022-05-25 NOTE — Progress Notes (Signed)
Vitamin D level on labs from/12/24.  Prescription for ergocalciferol previously sent to pharmacy on 05/05/22.  When advised patient to pick up prescription and start medication if has not already done so

## 2022-05-26 DIAGNOSIS — R002 Palpitations: Secondary | ICD-10-CM | POA: Insufficient documentation

## 2022-05-26 NOTE — Telephone Encounter (Signed)
The result is not critical.  See result note.

## 2022-05-26 NOTE — Progress Notes (Signed)
Cardiology Office Note   Date:  05/27/2022   ID:  Katelyn Zimmerman, DOB Nov 09, 1975, MRN [id number]  PCP:  Deeann Saint, MD  Cardiologist:   Rollene Rotunda, MD Referring:  Deeann Saint, MD  Chief Complaint  Patient presents with   Palpitations      History of Present Illness: Katelyn Zimmerman is a 47 y.o. female who presents for evaluation of difficult to control HTN and palpitations.  She is referred by Deeann Saint, MD.  I reviewed primary care office records for this visit.  Patient has no past cardiac history.  Is been no past cardiac testing.  She did have mild carotid plaque on Doppler a couple of years ago and noticed.    She has had difficult to control hypertension since pregnancies with the last being in 2015.  She was treated with ACE inhibitor's in the past but that caused a cough.  I do see that she got labetalol at 1 point in time and hydrochlorothiazide but this was around the time of her pregnancy with labetalol.  She records her blood pressures very commonly with diastolics in the 100 range and systolics in the 180s.  She actually had a CT of her head in 2022 and was found to have a possible small lacunar infarct in the posterior left cerebellum.  This was done to evaluate dizziness.  She had blood work to include normal thyroid and electrolytes.  She has not had any other cardiac workup.  She denies any chest pressure, neck or arm discomfort.  Also describes palpitations.  This happens daily.  She describes skipping heartbeats.  These are not triggered by anything in particular.  She has not any presyncope or syncope.  She denies any chest pressure, neck or arm discomfort.  She has had some weight gain.  She does a little walking for exercise and does not bring on any cardiovascular symptoms with this.   Past Medical History:  Diagnosis Date   Abnormal Pap smear    Depression    Fibroid    Hyperlipidemia    Hypertension    UTI (urinary tract  infection)     Past Surgical History:  Procedure Laterality Date   CHOLECYSTECTOMY     NO PAST SURGERIES       Current Outpatient Medications  Medication Sig Dispense Refill   buPROPion (WELLBUTRIN XL) 300 MG 24 hr tablet Take 1 tablet (300 mg total) by mouth daily. SCHEDULE APPT FOR FUTURE REFILLS. 30 tablet 0   irbesartan (AVAPRO) 150 MG tablet Take 1 tablet (150 mg total) by mouth daily. 30 tablet 4   labetalol (NORMODYNE) 200 MG tablet Take 1 tablet (200 mg total) by mouth 2 (two) times daily. 180 tablet 3   Vitamin D, Ergocalciferol, (DRISDOL) 1.25 MG (50000 UNIT) CAPS capsule Take 1 capsule (50,000 Units total) by mouth every 7 (seven) days. 12 capsule 0   No current facility-administered medications for this visit.    Allergies:   Patient has no known allergies.    Social History:  The patient  reports that she has been smoking cigarettes. She has a 5.00 pack-year smoking history. She has never used smokeless tobacco. She reports current alcohol use of about 2.0 standard drinks of alcohol per week. She reports that she does not use drugs.   Family History:  The patient's family history includes Arthritis in her mother; Diabetes in her father; Hypertension in her father and mother.    ROS:  Please see the history of present illness.   Otherwise, review of systems are positive for none.   All other systems are reviewed and negative.    PHYSICAL EXAM: VS:  BP (!) 183/112   Pulse 72   Ht 5\' 6"  (1.676 m)   Wt 214 lb (97.1 kg)   SpO2 100%   BMI 34.54 kg/m  , BMI Body mass index is 34.54 kg/m. GENERAL:  Well appearing HEENT:  Pupils equal round and reactive, fundi not visualized, oral mucosa unremarkable NECK:  No jugular venous distention, waveform within normal limits, carotid upstroke brisk and symmetric, no bruits, no thyromegaly LYMPHATICS:  No cervical, inguinal adenopathy LUNGS:  Clear to auscultation bilaterally BACK:  No CVA tenderness CHEST:   Unremarkable HEART:  PMI not displaced or sustained,S1 and S2 within normal limits, no S3, no S4, no clicks, no rubs, no murmurs ABD:  Flat, positive bowel sounds normal in frequency in pitch, no bruits, no rebound, no guarding, no midline pulsatile mass, no hepatomegaly, no splenomegaly EXT:  2 plus pulses throughout, no edema, no cyanosis no clubbing SKIN:  No rashes no nodules NEURO:  Cranial nerves II through XII grossly intact, motor grossly intact throughout PSYCH:  Cognitively intact, oriented to person place and time    EKG:  EKG is ordered today. The ekg ordered today demonstrates sinus rhythm, rate 72, axis within normal limits, intervals within normal limits, nonspecific T wave flattening.   Recent Labs: 04/27/2022: TSH 2.64 05/13/2022: ALT 16; BUN 17; Creat 1.23; Hemoglobin 14.8; Platelets 259; Potassium 3.7; Sodium 137    Lipid Panel    Component Value Date/Time   CHOL 264 (H) 02/10/2020 1248   TRIG 171.0 (H) 02/10/2020 1248   HDL 60.70 02/10/2020 1248   CHOLHDL 4 02/10/2020 1248   VLDL 34.2 02/10/2020 1248   LDLCALC 169 (H) 02/10/2020 1248      Wt Readings from Last 3 Encounters:  05/27/22 214 lb (97.1 kg)  04/27/22 215 lb 9.6 oz (97.8 kg)  11/12/21 212 lb (96.2 kg)      Other studies Reviewed: Additional studies/ records that were reviewed today include: Labs. Review of the above records demonstrates:  Please see elsewhere in the note.     ASSESSMENT AND PLAN:  HTN: Today I am going to check her urinalysis.  I will order renal artery Dopplers.  I am going to prescribe labetalol 200 mg twice daily because of the palpitations and because she tolerated this previously.  I will have her come back and be seen in our Pharm.D. clinic for med titration.  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  Of note she is scheduled for sleep apnea study.  Palpitations: I will check a 2-week ZIO monitor.  Tobacco use: I have asked her to call [phone]  Vascular  disease: The patient did have some mild carotid plaque and had previous lacunar infarcts.  I like to check lipid profile and LP(a).  The last LDL I see was 169 a few years ago.  I would like her to follow Mediterranean diet.  She might need a statin.   Current medicines are reviewed at length with the patient today.  The patient does not have concerns regarding medicines.  The following changes have been made:  no change  Labs/ tests ordered today include:   Orders Placed This Encounter  Procedures   UA/M w/rflx Culture, Routine   Lipoprotein A (LPA)   Lipid panel   AMB Referral to [facility]  LONG TERM MONITOR (3-14 DAYS)   EKG 12-Lead   VAS US RENAL ARTERY DUPLEX     Disposition:   FU with HTN Clinic in one month and see me in two months.     Signed, Rollene Rotunda, MD  05/27/2022 10:51 AM    [facility]

## 2022-05-27 ENCOUNTER — Ambulatory Visit: Payer: Commercial Managed Care - PPO | Attending: Cardiology | Admitting: Cardiology

## 2022-05-27 ENCOUNTER — Encounter: Payer: Self-pay | Admitting: Cardiology

## 2022-05-27 ENCOUNTER — Ambulatory Visit: Payer: Commercial Managed Care - PPO

## 2022-05-27 VITALS — BP 183/112 | HR 72 | Ht 66.0 in | Wt 214.0 lb

## 2022-05-27 DIAGNOSIS — R002 Palpitations: Secondary | ICD-10-CM | POA: Diagnosis not present

## 2022-05-27 DIAGNOSIS — I10 Essential (primary) hypertension: Secondary | ICD-10-CM

## 2022-05-27 MED ORDER — LABETALOL HCL 200 MG PO TABS: 200.0000 mg | ORAL_TABLET | Freq: Two times a day (BID) | ORAL | 3 refills | Status: DC

## 2022-05-27 NOTE — Patient Instructions (Addendum)
Medication Instructions:   START Labetalol 200 mg 2 times a day   *If you need a refill on your cardiac medications before your next appointment, please call your pharmacy*  Lab Work: Your physician recommends that you have lab work TODAY:  UA/M w/rflx  Your physician recommends that you return for lab work in 1 month :  Fasting Lipid Panel-DO NOT eat or drink past midnight. Okay to have water and/or black coffee only the morning of lab work. Lipoprotein A (LPa)  If you have labs (blood work) drawn today and your tests are completely normal, you will receive your results only by: MyChart Message (if you have MyChart) OR A paper copy in the mail If you have any lab test that is abnormal or we need to change your treatment, we will call you to review the results.   Testing/Procedures: Your physician has requested that you have a renal artery duplex. During this test, an ultrasound is used to evaluate blood flow to the kidneys. Allow one hour for this exam. Do not eat after midnight the day before and avoid carbonated beverages. Take your medications as you usually do.  ZIO XT- Long Term Monitor Instructions  Your physician has requested you wear a ZIO patch monitor for 14 days.  This is a single patch monitor. [employer] supplies one patch monitor per enrollment. Additional stickers are not available. Please do not apply patch if you will be having a Nuclear Stress Test,  Echocardiogram, Cardiac CT, MRI, or Chest Xray during the period you would be wearing the  monitor. The patch cannot be worn during these tests. You cannot remove and re-apply the  ZIO XT patch monitor.  Your ZIO patch monitor will be mailed 3 day USPS to your address on file. It may take 3-5 days  to receive your monitor after you have been enrolled.  Once you have received your monitor, please review the enclosed instructions. Your monitor  has already been registered assigning a specific monitor serial # to  you.  Billing and Patient Assistance Program Information  We have supplied [employer] with any of your insurance information on file for billing purposes. [employer] offers a sliding scale Patient Assistance Program for patients that do not have  insurance, or whose insurance does not completely cover the cost of the ZIO monitor.  You must apply for the Patient Assistance Program to qualify for this discounted rate.  To apply, please call [employer] at [phone], select option 4, select option 2, ask to apply for  Patient Assistance Program. Meredeth Ide will ask your household income, and how many people  are in your household. They will quote your out-of-pocket cost based on that information.  [employer] will also be able to set up a 59-month, interest-free payment plan if needed.  Applying the monitor   Shave hair from upper left chest.  Hold abrader disc by orange tab. Rub abrader in 40 strokes over the upper left chest as  indicated in your monitor instructions.  Clean area with 4 enclosed alcohol pads. Let dry.  Apply patch as indicated in monitor instructions. Patch will be placed under collarbone on left  side of chest with arrow pointing upward.  Rub patch adhesive wings for 2 minutes. Remove white label marked "1". Remove the white  label marked "2". Rub patch adhesive wings for 2 additional minutes.  While looking in a mirror, press and release button in center of patch. A small green light will  flash 3-4 times. This  will be your only indicator that the monitor has been turned on.  Do not shower for the first 24 hours. You may shower after the first 24 hours.  Press the button if you feel a symptom. You will hear a small click. Record Date, Time and  Symptom in the Patient Logbook.  When you are ready to remove the patch, follow instructions on the last 2 pages of Patient  Logbook. Stick patch monitor onto the last page of Patient Logbook.  Place Patient Logbook in the blue and white box.  Use locking tab on box and tape box closed  securely. The blue and white box has prepaid postage on it. Please place it in the mailbox as  soon as possible. Your physician should have your test results approximately 7 days after the  monitor has been mailed back to [facility].  Call [facility] Customer Care at [phone] if you have questions regarding  your ZIO XT patch monitor. Call them immediately if you see an orange light blinking on your  monitor.  If your monitor falls off in less than 4 days, contact our Monitor department at [phone].  If your monitor becomes loose or falls off after 4 days call [employer] at [phone] for  suggestions on securing your monitor  Follow-Up: At [facility], you and your health needs are our priority.  As part of our continuing mission to provide you with exceptional heart care, we have created designated Provider Care Teams.  These Care Teams include your primary Cardiologist (physician) and Advanced Practice Providers (APPs -  Physician Assistants and Nurse Practitioners) who all work together to provide you with the care you need, when you need it.  Your next appointment:   1 month(s) 2 month(s)  Provider:   [facility]  Rollene Rotunda, MD     Other Instructions Call [phone]

## 2022-05-27 NOTE — Progress Notes (Unsigned)
Enrolled for [employer] to mail a ZIO XT long term holter monitor to the patients address on file.  

## 2022-05-28 LAB — UA/M W/RFLX CULTURE, ROUTINE
Bilirubin, UA: NEGATIVE
Glucose, UA: NEGATIVE
Ketones, UA: NEGATIVE
Leukocytes,UA: NEGATIVE
Nitrite, UA: NEGATIVE
RBC, UA: NEGATIVE
Specific Gravity, UA: 1.021 (ref 1.005–1.030)
Urobilinogen, Ur: 0.2 mg/dL (ref 0.2–1.0)
pH, UA: 5.5 (ref 5.0–7.5)

## 2022-05-28 LAB — MICROSCOPIC EXAMINATION
Bacteria, UA: NONE SEEN
Casts: NONE SEEN /lpf
WBC, UA: NONE SEEN /hpf (ref 0–5)

## 2022-06-14 ENCOUNTER — Ambulatory Visit (HOSPITAL_COMMUNITY)
Admission: RE | Admit: 2022-06-14 | Discharge: 2022-06-14 | Disposition: A | Discharge status: Home / Self Care | Payer: Commercial Managed Care - PPO | Source: Ambulatory Visit | Attending: Cardiology | Admitting: Cardiology

## 2022-06-14 DIAGNOSIS — I10 Essential (primary) hypertension: Secondary | ICD-10-CM

## 2022-06-22 ENCOUNTER — Encounter: Payer: Self-pay | Admitting: *Deleted

## 2022-07-15 ENCOUNTER — Ambulatory Visit: Payer: Commercial Managed Care - PPO

## 2022-07-15 NOTE — Progress Notes (Deleted)
Patient ID: Katelyn Zimmerman                 DOB: 12-30-1975                      MRN: [id number]      HPI: Katelyn Zimmerman is a 47 y.o. female referred by Dr. Marland Kitchen to HTN clinic. PMH is significant for  Current HTN meds:  Previously tried:  BP goal:   Family History:   Social History:   Diet:   Exercise:  {types:28256}  Home BP readings:  Date SBP/DBP  HR              Average      Wt Readings from Last 3 Encounters:  05/27/22 214 lb (97.1 kg)  04/27/22 215 lb 9.6 oz (97.8 kg)  11/12/21 212 lb (96.2 kg)   BP Readings from Last 3 Encounters:  05/27/22 (!) 183/112  04/27/22 (!) 190/100  11/12/21 128/80   Pulse Readings from Last 3 Encounters:  05/27/22 72  04/27/22 67  11/12/21 82    Renal function: CrCl cannot be calculated (Patient's most recent lab result is older than the maximum 21 days allowed.).  Past Medical History:  Diagnosis Date   Abnormal Pap smear    Depression    Fibroid    Hyperlipidemia    Hypertension    UTI (urinary tract infection)     Current Outpatient Medications on File Prior to Visit  Medication Sig Dispense Refill   buPROPion (WELLBUTRIN XL) 300 MG 24 hr tablet Take 1 tablet (300 mg total) by mouth daily. SCHEDULE APPT FOR FUTURE REFILLS. 30 tablet 0   irbesartan (AVAPRO) 150 MG tablet Take 1 tablet (150 mg total) by mouth daily. 30 tablet 4   labetalol (NORMODYNE) 200 MG tablet Take 1 tablet (200 mg total) by mouth 2 (two) times daily. 180 tablet 3   Vitamin D, Ergocalciferol, (DRISDOL) 1.25 MG (50000 UNIT) CAPS capsule Take 1 capsule (50,000 Units total) by mouth every 7 (seven) days. 12 capsule 0   No current facility-administered medications on file prior to visit.    No Known Allergies  unknown if currently breastfeeding.   Assessment/Plan:  1. Hypertension -  No problem-specific Assessment & Plan notes found for this encounter.      Thank you  Carmela Hurt, Pharm.D [facility]  Division of [facility] [address] [place], [place] [address]  Phone: [phone]; Fax: [phone]

## 2022-07-25 DIAGNOSIS — I739 Peripheral vascular disease, unspecified: Secondary | ICD-10-CM | POA: Insufficient documentation

## 2022-07-25 NOTE — Progress Notes (Deleted)
Cardiology Office Note:   Date:  07/25/2022  ID:  Zamorah Ailes, DOB 29-Oct-1975, MRN [id number] PCP: Deeann Saint, MD  [facility] Providers Cardiologist:  Rollene Rotunda, MD {  History of Present Illness:   Katelyn Zimmerman is a 47 y.o. female who presents for evaluation of difficult to control HTN and palpitations.  She is referred by Deeann Saint, MD.  I reviewed primary care office records for this visit.  Patient has no past cardiac history.  Is been no past cardiac testing.  She did have mild carotid plaque on Doppler a couple of years ago and noticed.  She has had difficult to control hypertension since pregnancies with the last being in 2015.  She was treated with ACE inhibitor's in the past but that caused a cough.  I do see that she got labetalol at 1 point in time and hydrochlorothiazide but this was around the time of her pregnancy with labetalol.  She actually had a CT of her head in 2022 and was found to have a possible small lacunar infarct in the posterior left cerebellum.    ***    *** This was done to evaluate dizziness.  She had blood work to include normal thyroid and electrolytes.  She has not had any other cardiac workup.  She denies any chest pressure, neck or arm discomfort.  Also describes palpitations.  This happens daily.  She describes skipping heartbeats.  These are not triggered by anything in particular.  She has not any presyncope or syncope.  She denies any chest pressure, neck or arm discomfort.  She has had some weight gain.  She does a little walking for exercise and does not bring on any cardiovascular symptoms with this.  ROS: ***  Studies Reviewed:    EKG:       ***  Risk Assessment/Calculations:   {Does this patient have ATRIAL FIBRILLATION?:[id number]} No BP recorded.  {Refresh Note OR Click here to enter BP  :1}***        Physical Exam:   VS:  There were no vitals taken for this visit.   Wt Readings from Last 3  Encounters:  05/27/22 214 lb (97.1 kg)  04/27/22 215 lb 9.6 oz (97.8 kg)  11/12/21 212 lb (96.2 kg)     GEN: Well nourished, well developed in no acute distress NECK: No JVD; No carotid bruits CARDIAC: ***RRR, no murmurs, rubs, gallops RESPIRATORY:  Clear to auscultation without rales, wheezing or rhonchi  ABDOMEN: Soft, non-tender, non-distended EXTREMITIES:  No edema; No deformity   ASSESSMENT AND PLAN:   HTN:  ***  Today I am going to check her urinalysis.  I will order renal artery Dopplers.  I am going to prescribe labetalol 200 mg twice daily because of the palpitations and because she tolerated this previously.  I will have her come back and be seen in our Pharm.D. clinic for med titration.  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  Of note she is scheduled for sleep apnea study.   Palpitations: ***   will check a 2-week ZIO monitor.   Tobacco use:   ***  I have asked her to call [phone]   Vascular disease: ***  he patient did have some mild carotid plaque and had previous lacunar infarcts.  I like to check lipid profile and LP(a).  The last LDL I see was 169 a few years ago.  I would like her to follow Mediterranean diet.  She  might need a statin.    {Are you ordering a CV Procedure (e.g. stress test, cath, DCCV, TEE, etc)?   Press F2        :[id number]}  Follow up ***  Signed, Rollene Rotunda, MD

## 2022-07-27 ENCOUNTER — Ambulatory Visit: Payer: Commercial Managed Care - PPO | Attending: Cardiology | Admitting: Cardiology

## 2022-07-27 DIAGNOSIS — I10 Essential (primary) hypertension: Secondary | ICD-10-CM

## 2022-07-27 DIAGNOSIS — R002 Palpitations: Secondary | ICD-10-CM

## 2022-07-27 DIAGNOSIS — I739 Peripheral vascular disease, unspecified: Secondary | ICD-10-CM

## 2022-08-05 ENCOUNTER — Other Ambulatory Visit: Payer: Commercial Managed Care - PPO

## 2022-08-10 ENCOUNTER — Other Ambulatory Visit: Payer: Self-pay | Admitting: Family Medicine

## 2022-08-10 DIAGNOSIS — E559 Vitamin D deficiency, unspecified: Secondary | ICD-10-CM

## 2022-08-11 ENCOUNTER — Ambulatory Visit: Payer: Commercial Managed Care - PPO | Attending: Family Medicine

## 2022-08-27 ENCOUNTER — Other Ambulatory Visit: Payer: Self-pay | Admitting: Family Medicine

## 2022-08-27 DIAGNOSIS — F322 Major depressive disorder, single episode, severe without psychotic features: Secondary | ICD-10-CM

## 2022-08-27 DIAGNOSIS — F411 Generalized anxiety disorder: Secondary | ICD-10-CM

## 2023-02-04 IMAGING — MR MR HEAD W/O CM
12 of 13 series · 44 of 48 positions shown · non-contrast
Comparison: None.

CLINICAL DATA: Headache and blurry vision

EXAM:
MRI HEAD WITHOUT CONTRAST
TECHNIQUE: Multiplanar, multiecho pulse sequences of the brain and surrounding
structures were obtained without intravenous contrast.

[Series 5: DWI · axial · 3.0mm · 0.88mm/px · z∈[-143,-2]mm · 9 of 104 slices shown (1 of 4)]
[im 1/104]
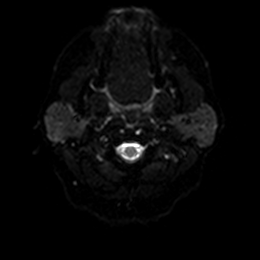
[im 13/104]
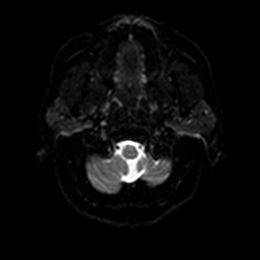
[im 26/104]
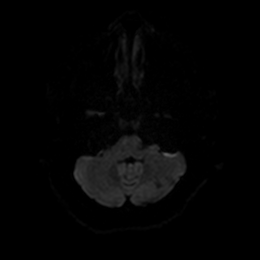
[im 39/104]
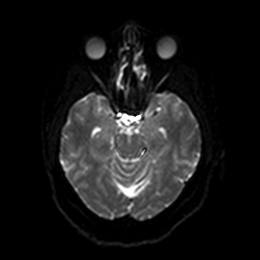
[im 52/104]
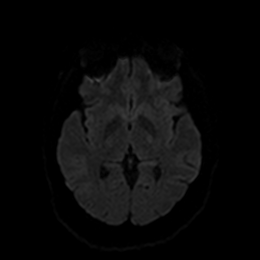
[im 65/104]
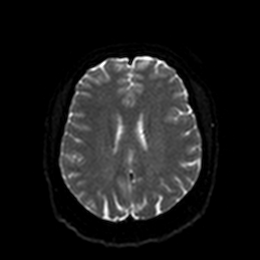
[im 78/104]
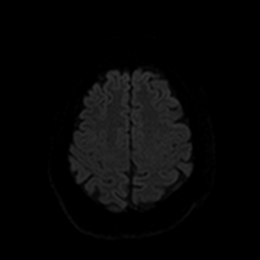
[im 91/104]
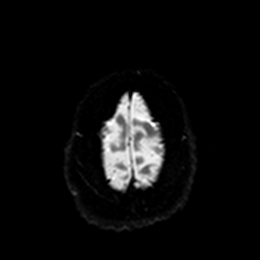
[im 104/104]
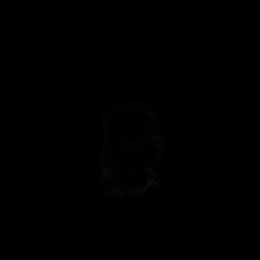

[Series 6: DWI · axial · 3.0mm · 0.88mm/px · z∈[-143,-2]mm · 4 of 52 slices shown (2 of 4)]
[im 1/52]
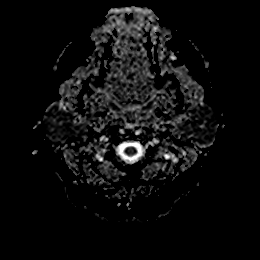
[im 18/52]
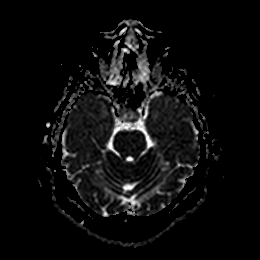
[im 35/52]
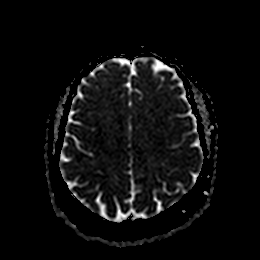
[im 52/52]
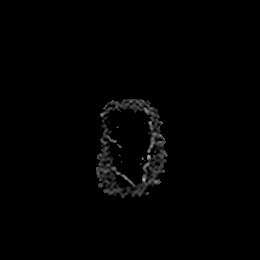

[Series 7: DWI · coronal · 4.0mm · 0.88mm/px · 5 of 63 slices shown (3 of 4)]
[im 1/63]
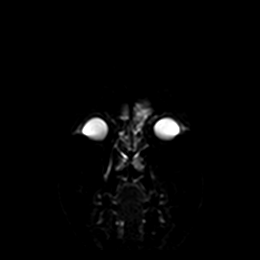
[im 16/63]
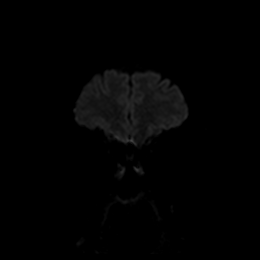
[im 32/63]
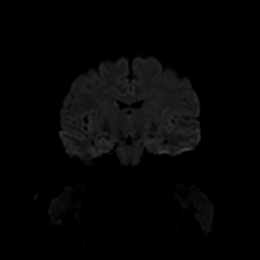
[im 47/63]
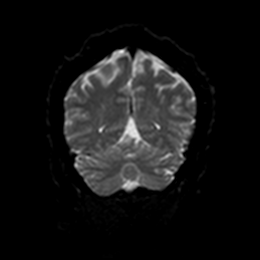
[im 63/63]
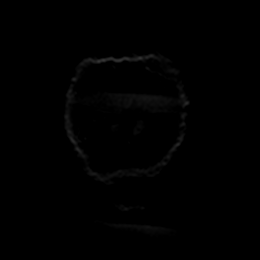

[Series 8: DWI · coronal · 4.0mm · 0.88mm/px · 2 of 32 slices shown (4 of 4)]
[im 1/32]
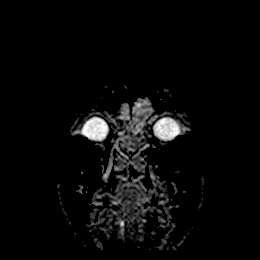
[im 32/32]
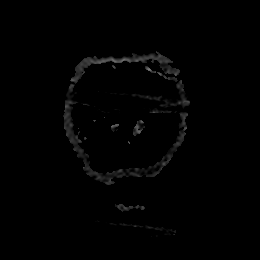

[Series 9: T1 · sagittal · 5.0mm · 0.75mm/px · 2 of 23 slices shown]
[im 1/23]
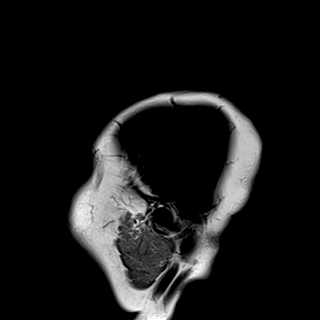
[im 23/23]
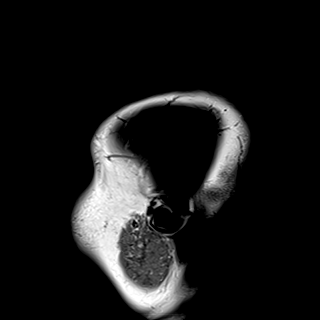

[Series 10: T2 · axial · 5.0mm · 0.72mm/px · z∈[-144,-0]mm · 2 of 27 slices shown (1 of 2)]
[im 1/27]
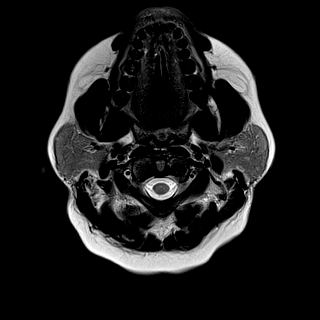
[im 27/27]
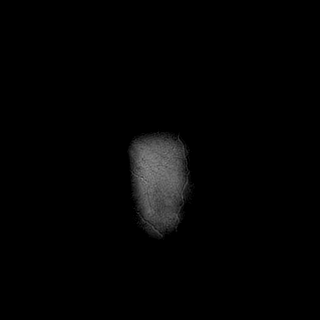

[Series 11: FLAIR · axial · 5.0mm · 0.45mm/px · z∈[-143,+1]mm · 2 of 27 slices shown]
[im 1/27]
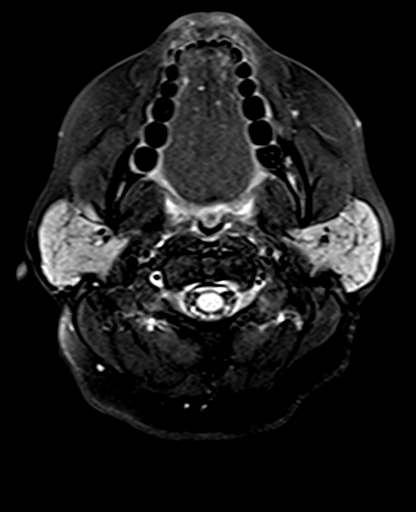
[im 27/27]
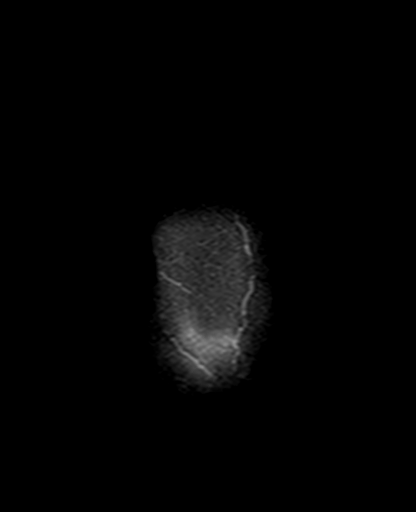

[Series 12: mag_images · axial · 3.0mm · 0.90mm/px · z∈[-147,+5]mm · 4 of 56 slices shown]
[im 1/56]
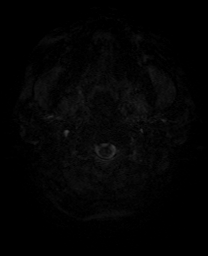
[im 19/56]
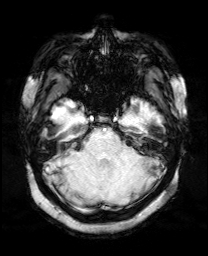
[im 37/56]
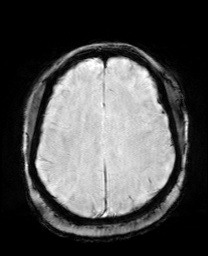
[im 56/56]
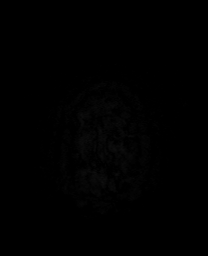

[Series 13: pha_images · axial · 3.0mm · 0.90mm/px · z∈[-147,+5]mm · 4 of 56 slices shown]
[im 1/56]
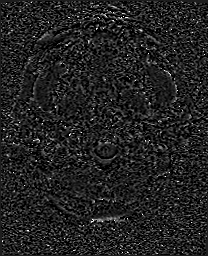
[im 19/56]
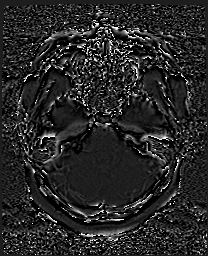
[im 37/56]
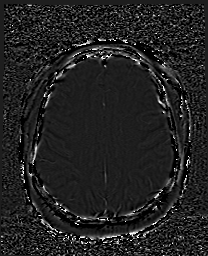
[im 56/56]
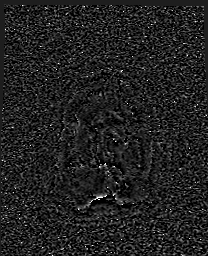

[Series 14: swi_images · axial · 3.0mm · 0.90mm/px · z∈[-147,+5]mm · 4 of 56 slices shown]
[im 1/56]
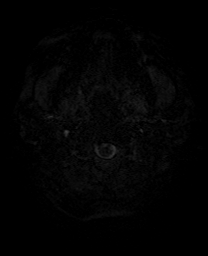
[im 19/56]
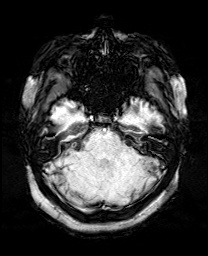
[im 37/56]
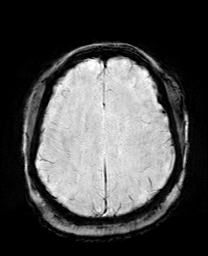
[im 56/56]
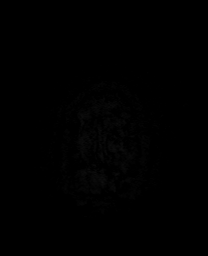

[Series 15: mip_images(sw) · axial · 24.0mm · 0.90mm/px · z∈[-137,-4]mm · 4 of 49 slices shown]
[im 1/49]
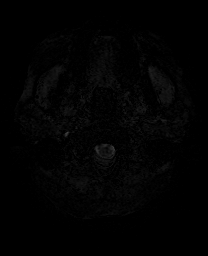
[im 17/49]
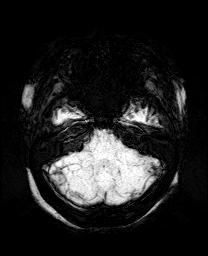
[im 33/49]
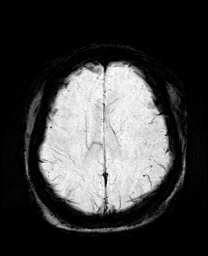
[im 49/49]
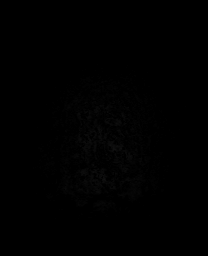

[Series 17: T2 · coronal · 5.0mm · 0.34mm/px · 2 of 29 slices shown (2 of 2)]
[im 1/29]
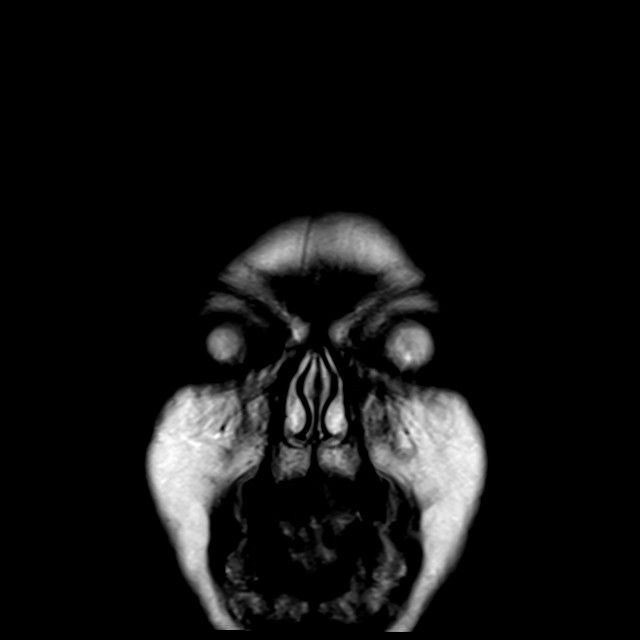
[im 29/29]
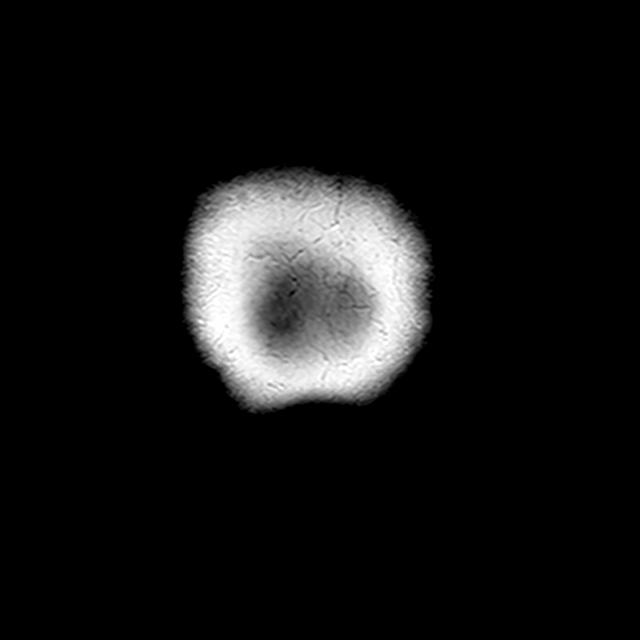

[44 of 48 positions shown; findings below may reference images not displayed]

FINDINGS: Brain: No acute infarct, mass effect or extra-axial collection. No
acute or chronic hemorrhage. Normal white matter signal, parenchymal
volume and CSF spaces. The midline structures are normal. Old small
vessel infarct of the left cerebellar hemisphere.

Vascular: Major flow voids are preserved.

Skull and upper cervical spine: Normal calvarium and skull base.
Visualized upper cervical spine and soft tissues are normal.

Sinuses/Orbits:No paranasal sinus fluid levels or advanced mucosal
thickening. No mastoid or middle ear effusion. Normal orbits.
IMPRESSION: 1. No acute intracranial abnormality.
2. Old small vessel infarct of the left cerebellar hemisphere.

## 2023-02-04 IMAGING — CT CT HEAD W/O CM
3 series · 16 of 47 positions shown, 19 images · non-contrast
Comparison: None.

CLINICAL DATA: Fell [REDACTED] and hip the back of her head. Dizziness.

EXAM:
CT HEAD WITHOUT CONTRAST
TECHNIQUE: Contiguous axial images were obtained from the base of the skull
through the vertex without intravenous contrast.

[Series 3: head 5.0 h30s · axial · 0.41mm/px · z∈[+1257,+1392]mm · 10 of 33 slices shown, 13 images]
[im 3/33  brain]
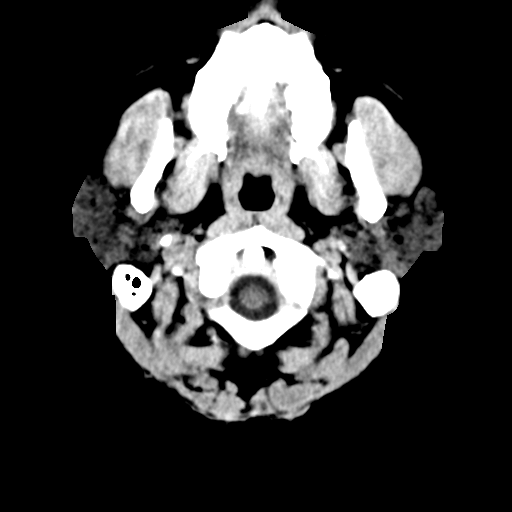
[im 3/33  bone]
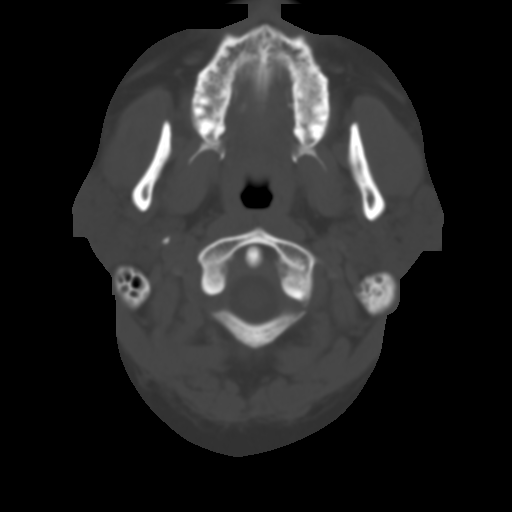
[im 6/33  brain]
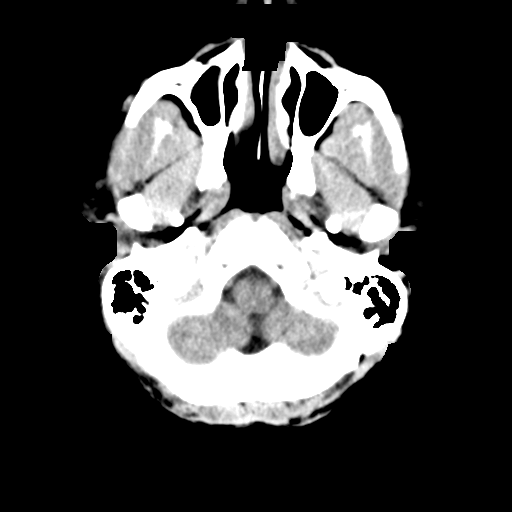
[im 9/33  brain]
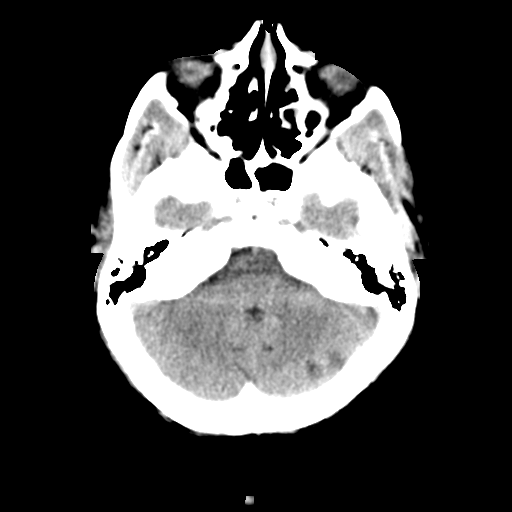
[im 12/33  brain]
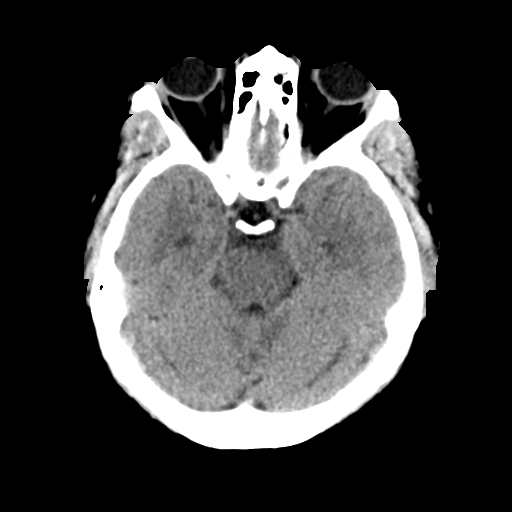
[im 15/33  brain]
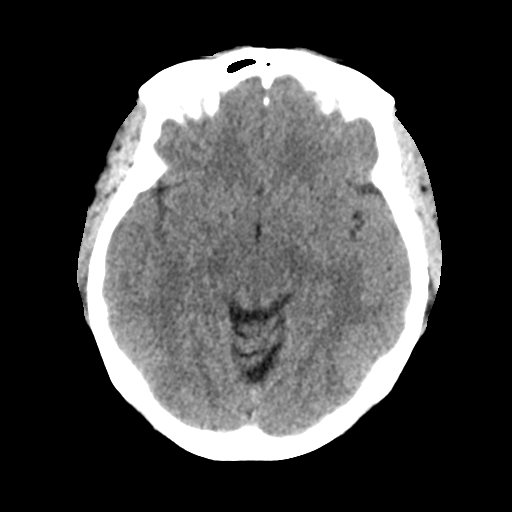
[im 15/33  bone]
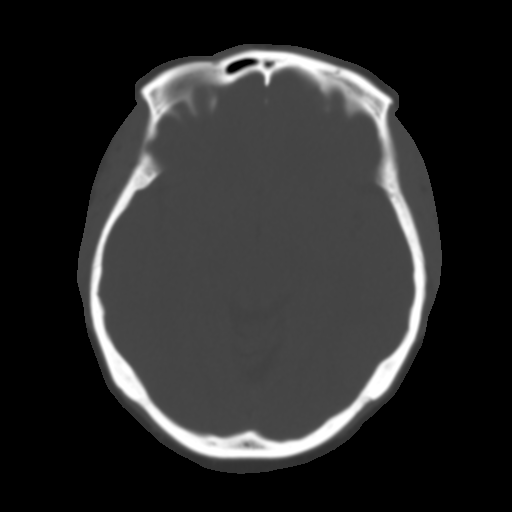
[im 18/33  brain]
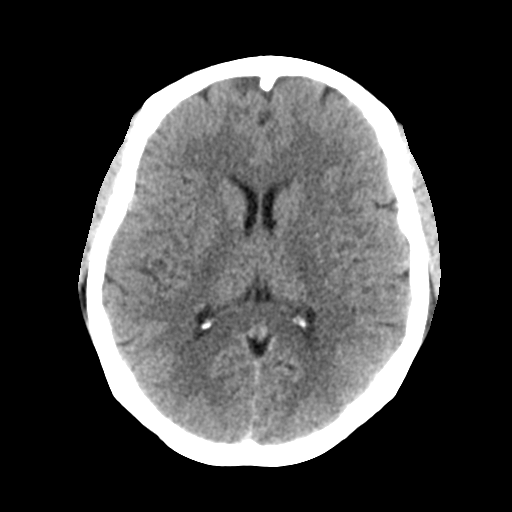
[im 21/33  brain]
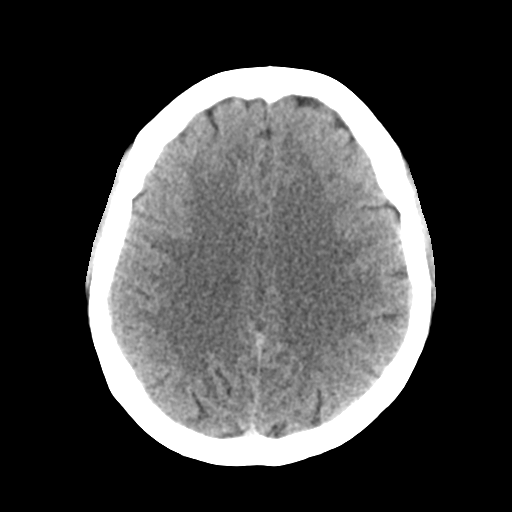
[im 25/33  brain]
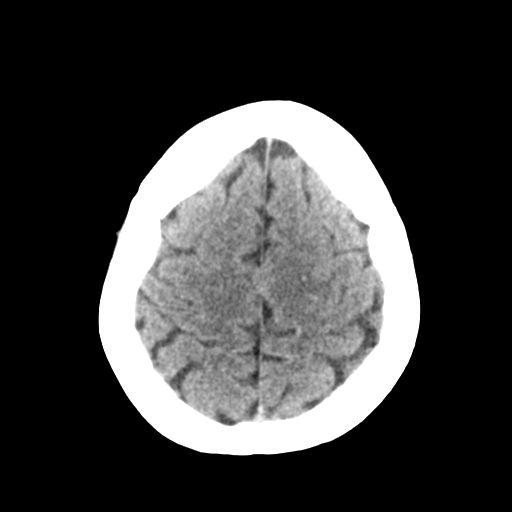
[im 27/33  brain]
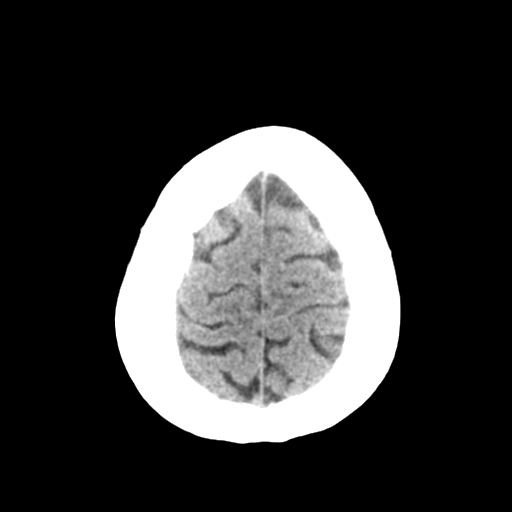
[im 27/33  bone]
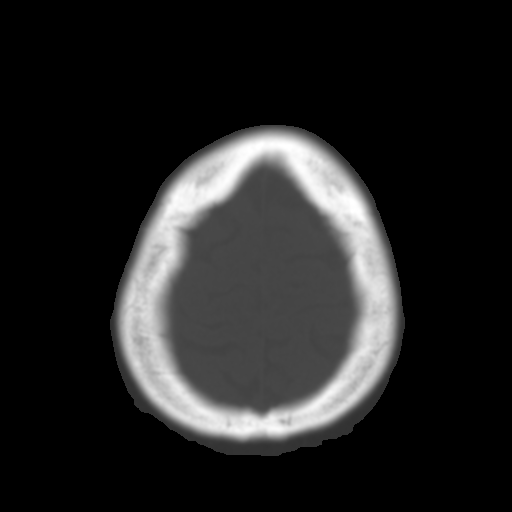
[im 30/33  brain]
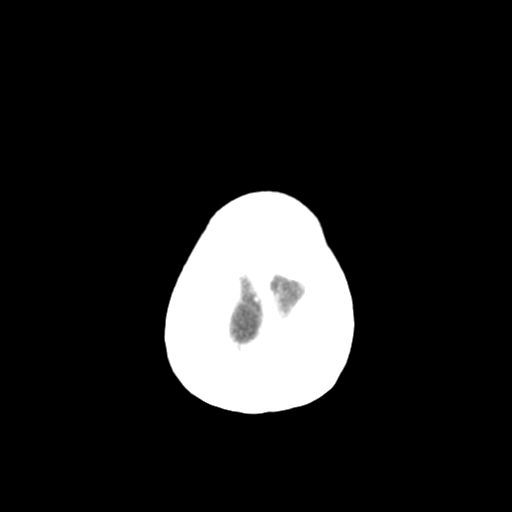

[Series 5: head 3.0 mpr cor · coronal · 0.32mm/px · 3 of 67 slices shown]
[im 23/67  brain]
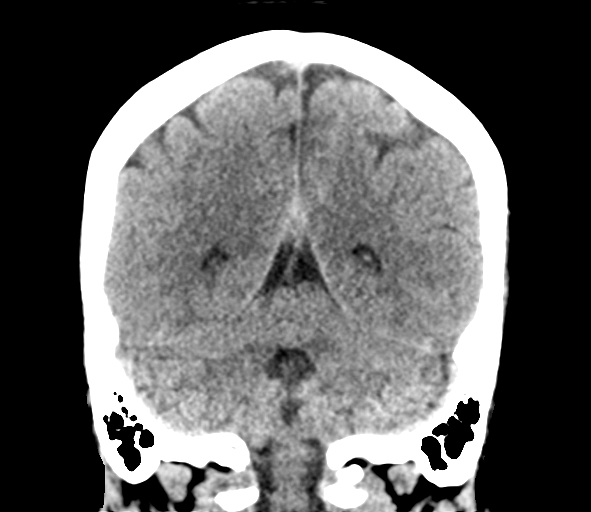
[im 30/67  brain]
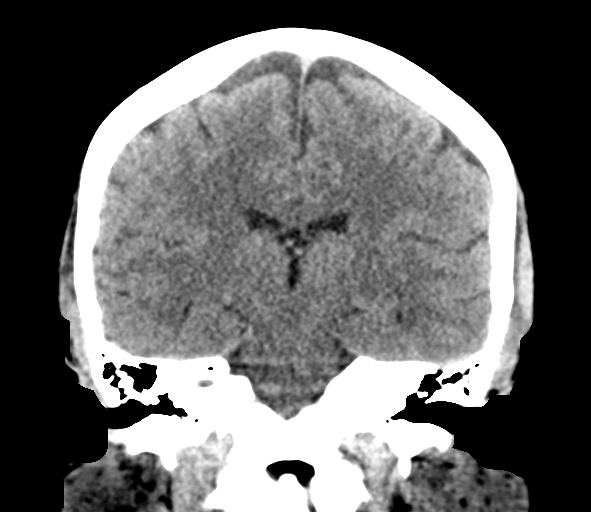
[im 37/67  brain]
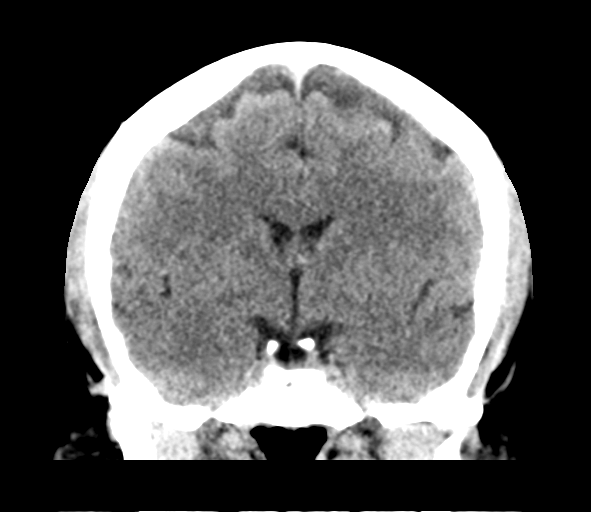

[Series 6: head 3.0 mpr sag · sagittal · 0.32mm/px · 3 of 58 slices shown]
[im 20/58  brain]
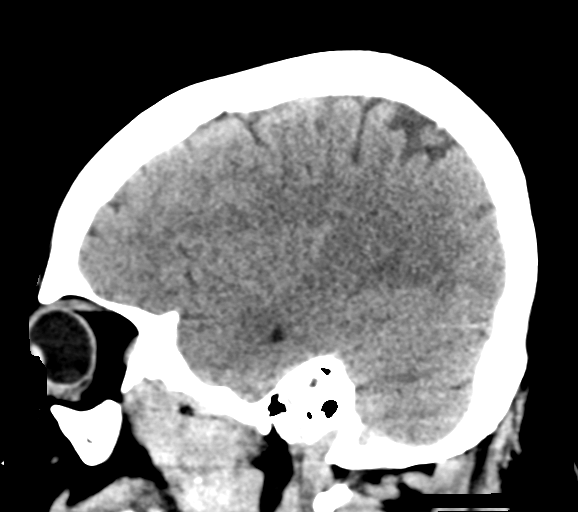
[im 29/58  brain]
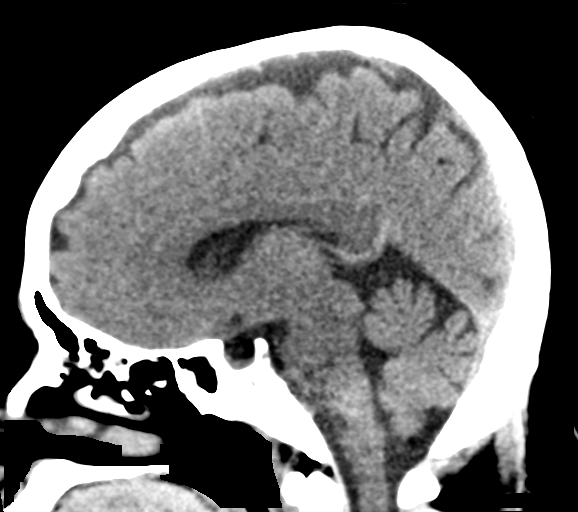
[im 39/58  brain]
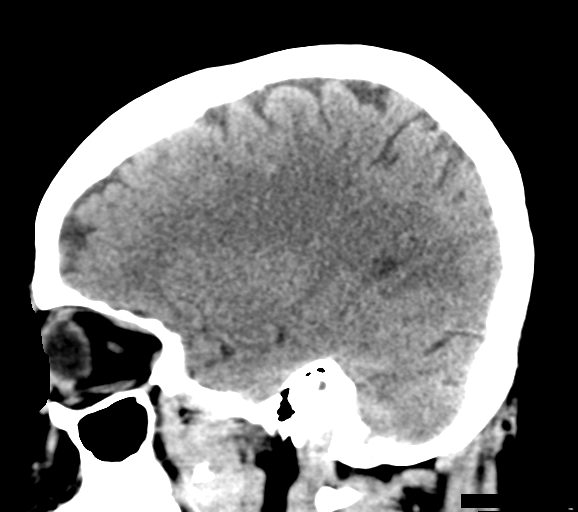

[16 of 47 positions shown; findings below may reference images not displayed]

FINDINGS: Brain: There is no evidence for acute hemorrhage, hydrocephalus,
mass lesion, or abnormal extra-axial fluid collection. No definite
CT evidence for acute infarction. Small lacunar infarct noted
posterior left cerebellum.

Vascular: No hyperdense vessel or unexpected calcification.

Skull: No evidence for fracture. No worrisome lytic or sclerotic
lesion.

Sinuses/Orbits: Chronic mucosal thickening noted ethmoid air cells
and maxillary sinuses. Visualized portions of the globes and
intraorbital fat are unremarkable.

Other: None.
IMPRESSION: 1. Small lacunar infarct in the posterior left cerebellum. While
likely nonacute, given the history of dizziness, MRI could be used
to further evaluate as clinically warranted.
2. Chronic paranasal sinus disease.

## 2023-02-16 ENCOUNTER — Encounter: Payer: Self-pay | Admitting: Family Medicine

## 2023-02-16 ENCOUNTER — Ambulatory Visit: Payer: Commercial Managed Care - PPO | Admitting: Family Medicine

## 2023-02-16 VITALS — BP 162/90 | HR 69 | Temp 98.1°F | Ht 66.0 in | Wt 215.0 lb

## 2023-02-16 DIAGNOSIS — Z13220 Encounter for screening for lipoid disorders: Secondary | ICD-10-CM | POA: Diagnosis not present

## 2023-02-16 DIAGNOSIS — E559 Vitamin D deficiency, unspecified: Secondary | ICD-10-CM

## 2023-02-16 DIAGNOSIS — L304 Erythema intertrigo: Secondary | ICD-10-CM | POA: Diagnosis not present

## 2023-02-16 DIAGNOSIS — Z0001 Encounter for general adult medical examination with abnormal findings: Secondary | ICD-10-CM

## 2023-02-16 DIAGNOSIS — I10 Essential (primary) hypertension: Secondary | ICD-10-CM

## 2023-02-16 DIAGNOSIS — R631 Polydipsia: Secondary | ICD-10-CM | POA: Diagnosis not present

## 2023-02-16 DIAGNOSIS — Z1231 Encounter for screening mammogram for malignant neoplasm of breast: Secondary | ICD-10-CM

## 2023-02-16 DIAGNOSIS — Z1211 Encounter for screening for malignant neoplasm of colon: Secondary | ICD-10-CM

## 2023-02-16 DIAGNOSIS — Z1159 Encounter for screening for other viral diseases: Secondary | ICD-10-CM

## 2023-02-16 DIAGNOSIS — Z0000 Encounter for general adult medical examination without abnormal findings: Secondary | ICD-10-CM

## 2023-02-16 LAB — LIPID PANEL
Cholesterol: 294 mg/dL — ABNORMAL HIGH (ref 0–200)
HDL: 57 mg/dL (ref >39.00)
LDL Cholesterol: 208 mg/dL — ABNORMAL HIGH (ref 0–99)
NonHDL: 236.94
Total CHOL/HDL Ratio: 5
Triglycerides: 146 mg/dL (ref 0.0–149.0)
VLDL: 29.2 mg/dL (ref 0.0–40.0)

## 2023-02-16 LAB — CBC WITH DIFFERENTIAL/PLATELET
Basophils Absolute: 0 10*3/uL (ref 0.0–0.1)
Basophils Relative: 1.1 % (ref 0.0–3.0)
Eosinophils Absolute: 0.2 10*3/uL (ref 0.0–0.7)
Eosinophils Relative: 4.6 % (ref 0.0–5.0)
HCT: 45.2 % (ref 36.0–46.0)
Hemoglobin: 14.7 g/dL (ref 12.0–15.0)
Lymphocytes Relative: 37 % (ref 12.0–46.0)
Lymphs Abs: 1.7 10*3/uL (ref 0.7–4.0)
MCHC: 32.5 g/dL (ref 30.0–36.0)
MCV: 88.7 fL (ref 78.0–100.0)
Monocytes Absolute: 0.4 10*3/uL (ref 0.1–1.0)
Monocytes Relative: 8.7 % (ref 3.0–12.0)
Neutro Abs: 2.2 10*3/uL (ref 1.4–7.7)
Neutrophils Relative %: 48.6 % (ref 43.0–77.0)
Platelets: 260 10*3/uL (ref 150.0–400.0)
RBC: 5.1 Mil/uL (ref 3.87–5.11)
RDW: 14.3 % (ref 11.5–15.5)
WBC: 4.6 10*3/uL (ref 4.0–10.5)

## 2023-02-16 LAB — COMPREHENSIVE METABOLIC PANEL
ALT: 19 U/L (ref 0–35)
AST: 19 U/L (ref 0–37)
Albumin: 4.3 g/dL (ref 3.5–5.2)
Alkaline Phosphatase: 77 U/L (ref 39–117)
BUN: 15 mg/dL (ref 6–23)
CO2: 27 meq/L (ref 19–32)
Calcium: 9.3 mg/dL (ref 8.4–10.5)
Chloride: 103 meq/L (ref 96–112)
Creatinine, Ser: 1.02 mg/dL (ref 0.40–1.20)
GFR: 65.69 mL/min (ref >60.00)
Glucose, Bld: 102 mg/dL — ABNORMAL HIGH (ref 70–99)
Potassium: 4 meq/L (ref 3.5–5.1)
Sodium: 137 meq/L (ref 135–145)
Total Bilirubin: 0.5 mg/dL (ref 0.2–1.2)
Total Protein: 7.1 g/dL (ref 6.0–8.3)

## 2023-02-16 LAB — TSH: TSH: 2.71 u[IU]/mL (ref 0.35–5.50)

## 2023-02-16 LAB — T4, FREE: Free T4: 0.8 ng/dL (ref 0.60–1.60)

## 2023-02-16 LAB — HEMOGLOBIN A1C: Hgb A1c MFr Bld: 6 % (ref 4.6–6.5)

## 2023-02-16 MED ORDER — NIFEDIPINE ER OSMOTIC RELEASE 30 MG PO TB24
30.0000 mg | ORAL_TABLET | Freq: Every day | ORAL | 3 refills | Status: AC
Start: 2023-02-16 — End: ?

## 2023-02-16 MED ORDER — NYSTATIN-TRIAMCINOLONE 100000-0.1 UNIT/GM-% EX OINT
1.0000 | TOPICAL_OINTMENT | Freq: Two times a day (BID) | CUTANEOUS | 0 refills | Status: AC
Start: 2023-02-16 — End: ?

## 2023-02-16 NOTE — Patient Instructions (Signed)
Prescription for Procardia XL 30 mg daily was sent to pharmacy.  This is a medication for you to take for blood pressure.  In reviewing the charts it appears you have been out of your other medications for several months.  You can try taking this medication once daily for blood pressure.  Monitor blood pressure closely and keep a log with you to clinic.  We will have you follow-up in 1 month for blood pressure.

## 2023-02-17 ENCOUNTER — Telehealth: Payer: Self-pay

## 2023-02-17 DIAGNOSIS — Z1211 Encounter for screening for malignant neoplasm of colon: Secondary | ICD-10-CM

## 2023-02-17 LAB — HEPATITIS C ANTIBODY: Hepatitis C Ab: NONREACTIVE

## 2023-02-17 NOTE — Addendum Note (Signed)
Addended by: Philipp Deputy A on: 02/17/2023 02:25 PM   Modules accepted: Orders

## 2023-02-17 NOTE — Telephone Encounter (Signed)
Copied from CRM [id number]. Topic: General - Other >> Feb 17, 2023 11:11 AM Truddie Crumble wrote: Reason for CRM: Pt called stating she would like to get the colonoscopy done instead of doing the color guard

## 2023-02-17 NOTE — Telephone Encounter (Signed)
Ok

## 2023-02-27 NOTE — Progress Notes (Signed)
Established Patient Office Visit   Subjective  Patient ID: Katelyn Zimmerman, female    DOB: Aug 17, 1975  Age: 48 y.o. MRN: [id number]  Chief Complaint  Patient presents with   Annual Exam    And pap    Patient is a 48 year old female seen for CPE.  Patient thinks she may also need a Pap.  Patient lost to follow-up on BP after last OFV.  Taking irbesartan 150 mg daily but feels like it makes her throat dry/irritated.  Patient notes increased thirst.  Patient mentions intermittent rash/irritation of skin.  Endorses increased sweating.    Patient Active Problem List   Diagnosis Date Noted   PVD (peripheral vascular disease) (HCC) 07/25/2022   Palpitations 05/26/2022   Essential hypertension 08/11/2020   Depression, major, single episode, severe (HCC) 08/11/2020   Obese 09/30/2016   Fatigue 09/30/2016   Anxiety 08/18/2016   Normal delivery 04/19/2013   Pregnancy induced hypertension 04/18/2013   Past Medical History:  Diagnosis Date   Abnormal Pap smear    Depression    Fibroid    Hyperlipidemia    Hypertension    UTI (urinary tract infection)    Past Surgical History:  Procedure Laterality Date   CHOLECYSTECTOMY     NO PAST SURGERIES     Social History   Tobacco Use   Smoking status: Every Day    Current packs/day: 0.25    Average packs/day: 0.3 packs/day for 20.0 years (5.0 ttl pk-yrs)    Types: Cigarettes   Smokeless tobacco: Never  Vaping Use   Vaping status: Never Used  Substance Use Topics   Alcohol use: Yes    Alcohol/week: 2.0 standard drinks of alcohol    Types: 2 Shots of liquor per week    Comment: occasionally   Drug use: No   Family History  Problem Relation Age of Onset   Arthritis Mother    Hypertension Mother    Hypertension Father    Diabetes Father    No Known Allergies    ROS Negative unless stated above    Objective:     BP (!) 162/90 (BP Location: Right Arm, Patient Position: Sitting, Cuff Size: Large)   Pulse 69    Temp 98.1 F (36.7 C) (Oral)   Ht 5\' 6"  (1.676 m)   Wt 215 lb (97.5 kg)   SpO2 97%   Breastfeeding No   BMI 34.70 kg/m  BP Readings from Last 3 Encounters:  02/16/23 (!) 162/90  05/27/22 (!) 183/112  04/27/22 (!) 190/100   Wt Readings from Last 3 Encounters:  02/16/23 215 lb (97.5 kg)  05/27/22 214 lb (97.1 kg)  04/27/22 215 lb 9.6 oz (97.8 kg)    Physical Exam Constitutional:      Appearance: Normal appearance.  HENT:     Head: Normocephalic and atraumatic.     Right Ear: Tympanic membrane, ear canal and external ear normal.     Left Ear: Tympanic membrane, ear canal and external ear normal.     Nose: Nose normal.     Mouth/Throat:     Mouth: Mucous membranes are moist.     Pharynx: No oropharyngeal exudate or posterior oropharyngeal erythema.  Eyes:     General: No scleral icterus.    Extraocular Movements: Extraocular movements intact.     Conjunctiva/sclera: Conjunctivae normal.     Pupils: Pupils are equal, round, and reactive to light.  Neck:     Thyroid: No thyromegaly.  Cardiovascular:  Rate and Rhythm: Normal rate and regular rhythm.     Pulses: Normal pulses.     Heart sounds: Normal heart sounds. No murmur heard.    No friction rub.  Pulmonary:     Effort: Pulmonary effort is normal.     Breath sounds: Normal breath sounds. No wheezing, rhonchi or rales.  Abdominal:     General: Bowel sounds are normal.     Palpations: Abdomen is soft.     Tenderness: There is no abdominal tenderness.  Musculoskeletal:        General: No deformity. Normal range of motion.  Lymphadenopathy:     Cervical: No cervical adenopathy.  Skin:    General: Skin is warm and dry.     Findings: Rash present.  Neurological:     General: No focal deficit present.     Mental Status: She is alert and oriented to person, place, and time.  Psychiatric:        Mood and Affect: Mood normal.        Thought Content: Thought content normal.      Results for orders placed or  performed in visit on 02/16/23  Hep C Antibody  Result Value Ref Range   Hepatitis C Ab NON-REACTIVE NON-REACTIVE  T4, Free  Result Value Ref Range   Free T4 0.80 0.60 - 1.60 ng/dL  TSH  Result Value Ref Range   TSH 2.71 0.35 - 5.50 uIU/mL  Lipid panel  Result Value Ref Range   Cholesterol 294 (H) 0 - 200 mg/dL   Triglycerides 161.0 0.0 - 149.0 mg/dL   HDL 96.04 >54.09 mg/dL   VLDL 81.1 0.0 - 91.4 mg/dL   LDL Cholesterol 782 (H) 0 - 99 mg/dL   Total CHOL/HDL Ratio 5    NonHDL 236.94   Hemoglobin A1c  Result Value Ref Range   Hgb A1c MFr Bld 6.0 4.6 - 6.5 %  Comprehensive metabolic panel  Result Value Ref Range   Sodium 137 135 - 145 mEq/L   Potassium 4.0 3.5 - 5.1 mEq/L   Chloride 103 96 - 112 mEq/L   CO2 27 19 - 32 mEq/L   Glucose, Bld 102 (H) 70 - 99 mg/dL   BUN 15 6 - 23 mg/dL   Creatinine, Ser 9.56 0.40 - 1.20 mg/dL   Total Bilirubin 0.5 0.2 - 1.2 mg/dL   Alkaline Phosphatase 77 39 - 117 U/L   AST 19 0 - 37 U/L   ALT 19 0 - 35 U/L   Total Protein 7.1 6.0 - 8.3 g/dL   Albumin 4.3 3.5 - 5.2 g/dL   GFR 21.30 >86.57 mL/min   Calcium 9.3 8.4 - 10.5 mg/dL  CBC with Differential/Platelet  Result Value Ref Range   WBC 4.6 4.0 - 10.5 K/uL   RBC 5.10 3.87 - 5.11 Mil/uL   Hemoglobin 14.7 12.0 - 15.0 g/dL   HCT 84.6 96.2 - 95.2 %   MCV 88.7 78.0 - 100.0 fl   MCHC 32.5 30.0 - 36.0 g/dL   RDW 84.1 32.4 - 40.1 %   Platelets 260.0 150.0 - 400.0 K/uL   Neutrophils Relative % 48.6 43.0 - 77.0 %   Lymphocytes Relative 37.0 12.0 - 46.0 %   Monocytes Relative 8.7 3.0 - 12.0 %   Eosinophils Relative 4.6 0.0 - 5.0 %   Basophils Relative 1.1 0.0 - 3.0 %   Neutro Abs 2.2 1.4 - 7.7 K/uL   Lymphs Abs 1.7 0.7 - 4.0 K/uL  Monocytes Absolute 0.4 0.1 - 1.0 K/uL   Eosinophils Absolute 0.2 0.0 - 0.7 K/uL   Basophils Absolute 0.0 0.0 - 0.1 K/uL      Assessment & Plan:  Well adult exam -Age-appropriate health screenings discussed -Obtain labs -Last Pap 2021.  Repeat Pap due  2026 -Discussed colon cancer screening.  Cologuard ordered this visit. -Mammogram encouraged.  Order placed this visit -Immunizations reviewed. -Next CPE in 1 year -     Lipid panel; Future  Essential hypertension -Uncontrolled -Discussed the importance of compliance with medication and follow-up.  Advised to potential consequences of uncontrolled BP. -BP improved on irbesartan 150 mg daily however will discontinue as patient feels it is causing dry mouth. -Start Procardia XL 30 mg daily -Patient advised to monitor BP at home and keep a log to bring with her to clinic.  Notify clinic for readings consistently greater than 140/90. -BP follow-up in 1 month. -     CBC with Differential/Platelet; Future -     Comprehensive metabolic panel; Future -     TSH; Future -     T4, free -     NIFEdipine ER Osmotic Release; Take 1 tablet (30 mg total) by mouth daily.  Dispense: 30 tablet; Refill: 3  Encounter for screening mammogram for malignant neoplasm of breast -     3D Screening Mammogram, Left and Right; Future  Colon cancer screening -Patient initially agreeable to Cologuard.  Contacted clinic the next day requesting colonoscopy.  Referral to GI placed.  Cologuard canceled.  Intertrigo -     Nystatin-Triamcinolone; Apply 1 Application topically 2 (two) times daily.  Dispense: 60 g; Refill: 0  Encounter for hepatitis C screening test for low risk patient -     Hepatitis C antibody  Increased thirst -     Hemoglobin A1c; Future   Return in about 4 weeks (around 03/16/2023) for blood pressure.   Deeann Saint, MD

## 2023-03-01 ENCOUNTER — Encounter: Payer: Self-pay | Admitting: Family Medicine

## 2023-03-01 DIAGNOSIS — I10 Essential (primary) hypertension: Secondary | ICD-10-CM

## 2023-03-03 ENCOUNTER — Other Ambulatory Visit: Payer: Self-pay | Admitting: Family Medicine

## 2023-03-03 MED ORDER — ROSUVASTATIN CALCIUM 10 MG PO TABS
10.0000 mg | ORAL_TABLET | Freq: Every day | ORAL | 3 refills | Status: AC
Start: 2023-03-03 — End: ?

## 2023-03-03 NOTE — Telephone Encounter (Signed)
Last Fill: Unk  Last OV: 02/16/23 Next OV: 03/20/23  Routing to provider for review/authorization.

## 2023-03-03 NOTE — Telephone Encounter (Signed)
Spoke with patient, medication has been sent to the pharmacy, patient would like to be placed on DM medication, explained to patient like Dr. Salomon Fick would like her to watch her intake and try to exercise.

## 2023-03-03 NOTE — Telephone Encounter (Signed)
Copied from CRM [id number]. Topic: Clinical - Medication Refill >> Mar 03, 2023 10:24 AM Elizebeth Brooking wrote: Most Recent Primary Care Visit:  Provider: Abbe Amsterdam R  Department: [facility]  Visit Type: PHYSICAL  Date: 02/16/2023  Medication: rosuvastatin 10 mg  Has the patient contacted their pharmacy? Yes (Agent: If no, request that the patient contact the pharmacy for the refill. If patient does not wish to contact the pharmacy document the reason why and proceed with request.) (Agent: If yes, when and what did the pharmacy advise?)  Is this the correct pharmacy for this prescription? Yes If no, delete pharmacy and type the correct one.  This is the patient's preferred pharmacy:  [facility] [phone] [place], [place] - [address] [place] [address] Phone: [phone] Fax: [phone]   Has the prescription been filled recently? No  Is the patient out of the medication? No  Has the patient been seen for an appointment in the last year OR does the patient have an upcoming appointment? Yes   Can we respond through MyChart? Yes  Agent: Please be advised that Rx refills may take up to 3 business days. We ask that you follow-up with your pharmacy.

## 2023-03-20 ENCOUNTER — Ambulatory Visit
Admission: RE | Admit: 2023-03-20 | Discharge: 2023-03-20 | Disposition: A | Discharge status: Home / Self Care | Payer: Commercial Managed Care - PPO | Source: Ambulatory Visit | Attending: Family Medicine

## 2023-03-20 ENCOUNTER — Ambulatory Visit: Payer: Commercial Managed Care - PPO | Admitting: Family Medicine

## 2023-03-20 DIAGNOSIS — Z1231 Encounter for screening mammogram for malignant neoplasm of breast: Secondary | ICD-10-CM

## 2023-03-24 IMAGING — US US THYROID
1 series · 13 of 25 positions shown · non-contrast
Comparison: 08/10/2020 carotid ultrasound

CLINICAL DATA: Thyroid nodule by carotid ultrasound

EXAM:
THYROID ULTRASOUND
TECHNIQUE: Ultrasound examination of the thyroid gland and adjacent soft
tissues was performed.

[Series 1: us thyroid · 0.06mm/px · 13 of 47 slices shown]
[im 1/47]
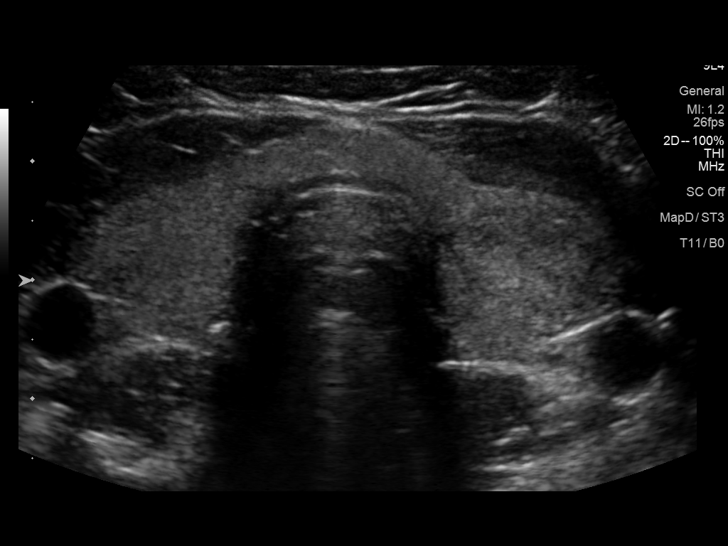
[im 4/47]
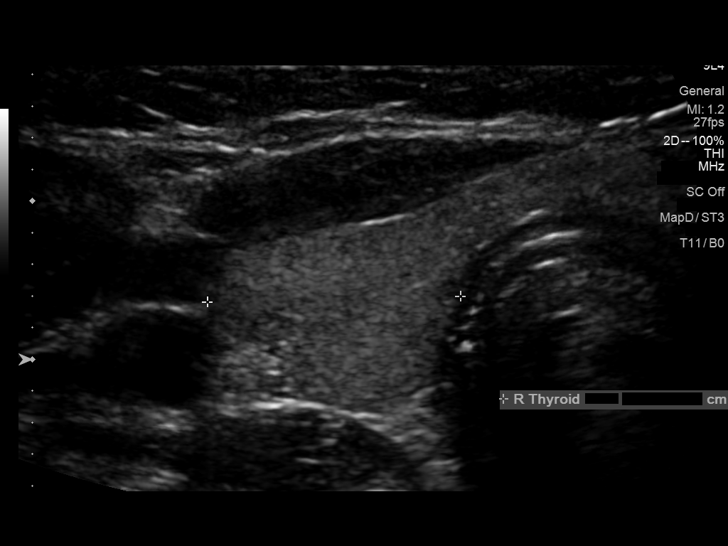
[im 8/47]
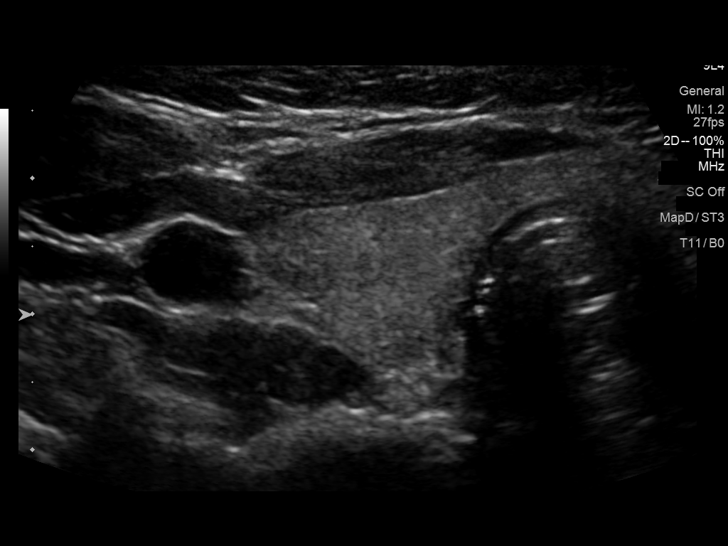
[im 12/47]
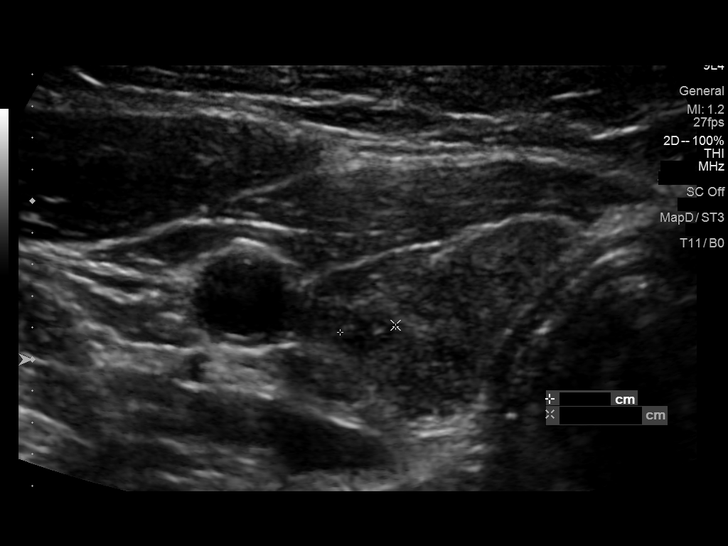
[im 16/47]
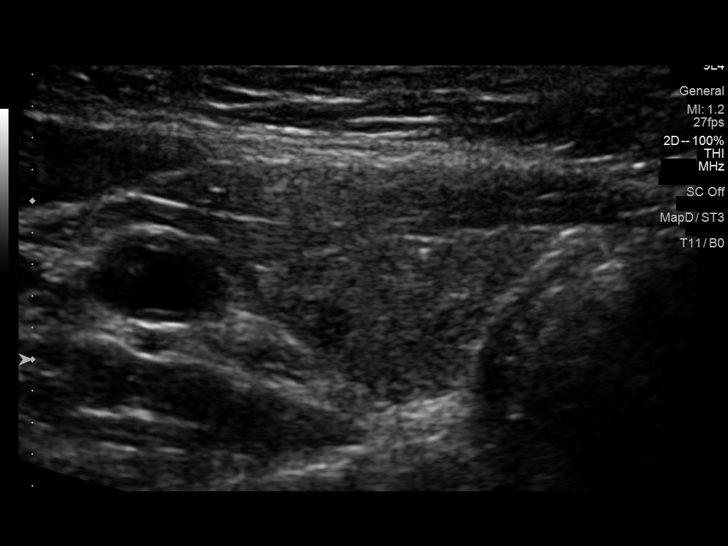
[im 20/47]
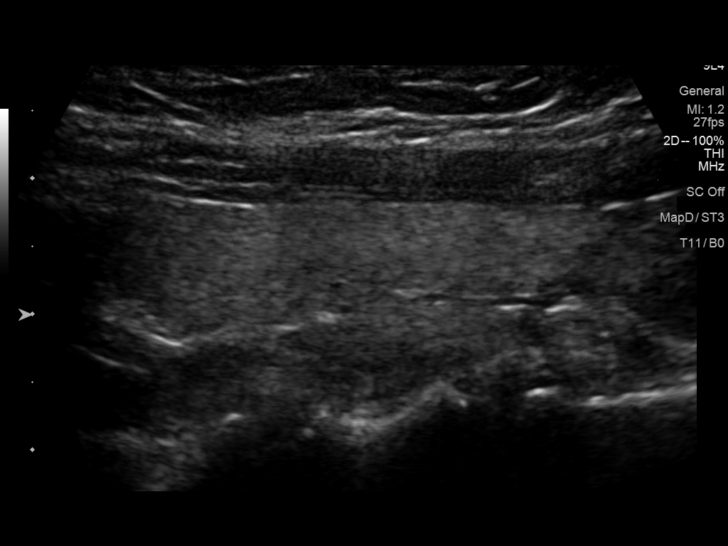
[im 24/47]
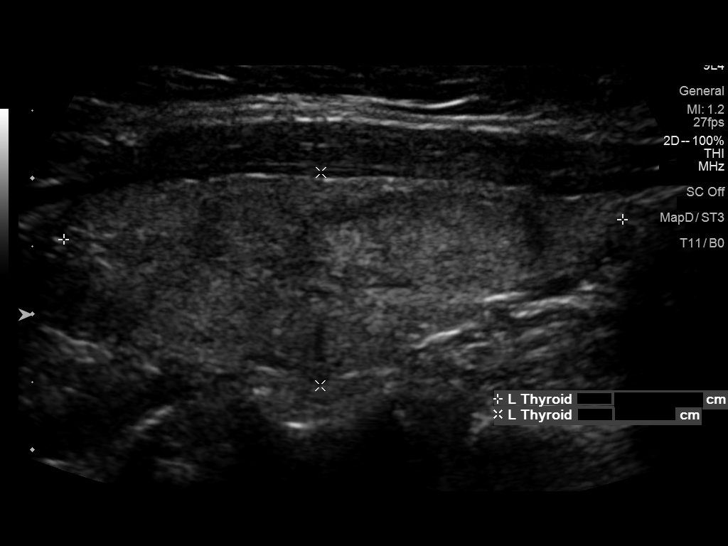
[im 27/47]
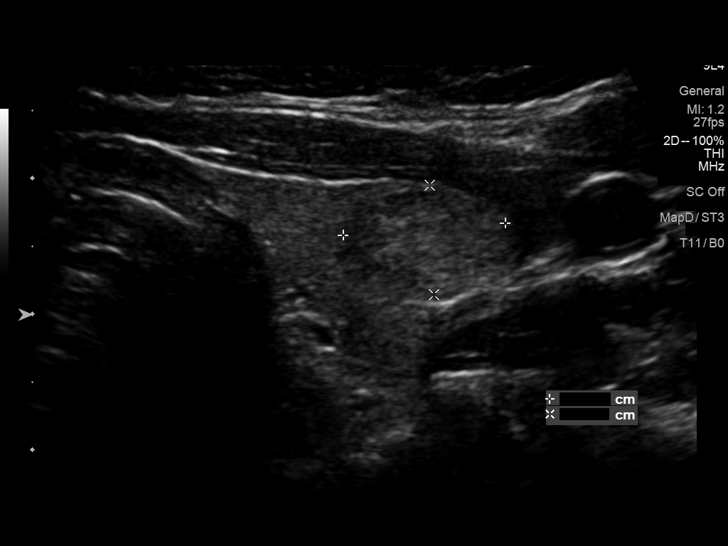
[im 31/47]
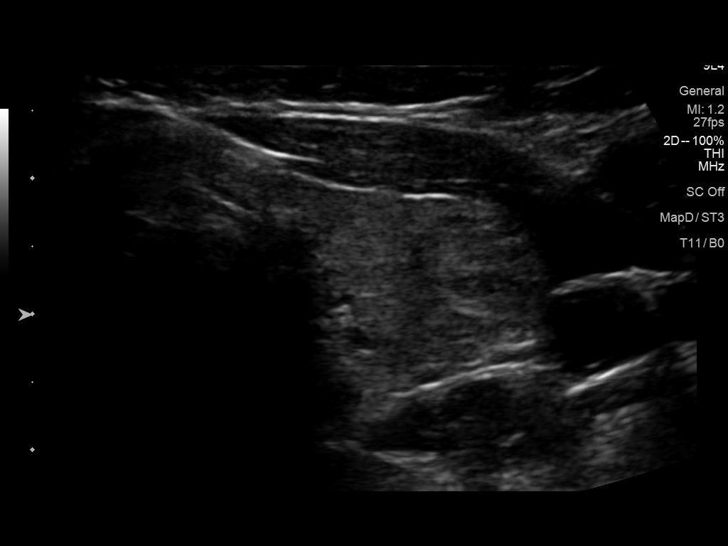
[im 35/47]
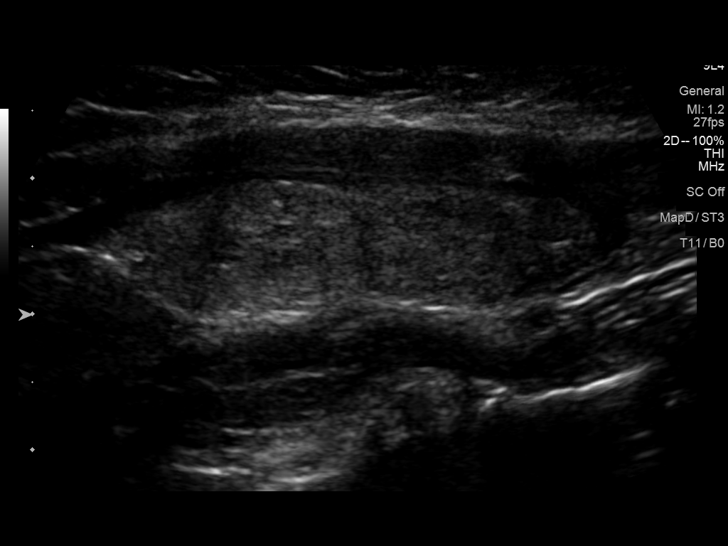
[im 39/47]
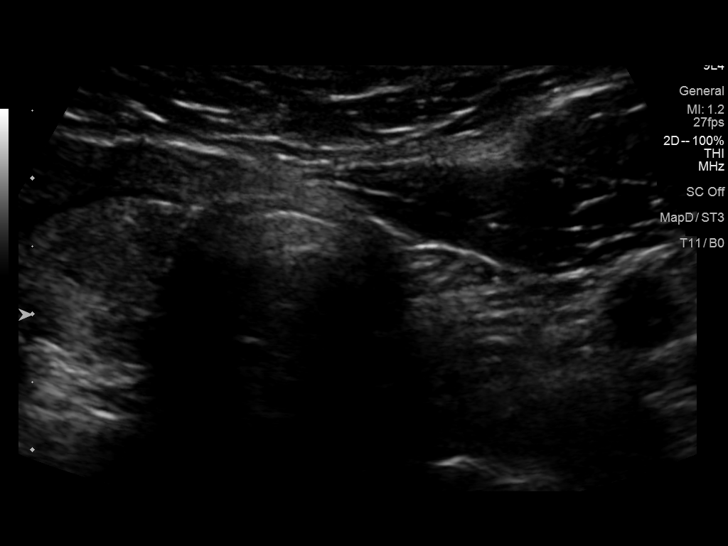
[im 43/47]
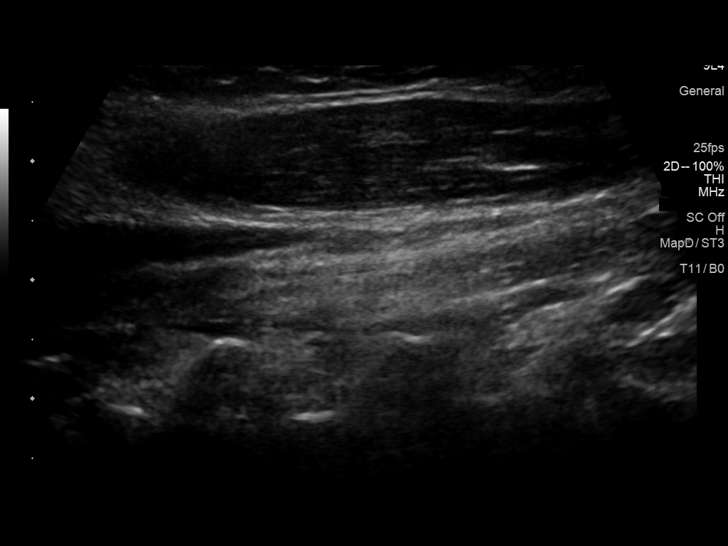
[im 47/47]
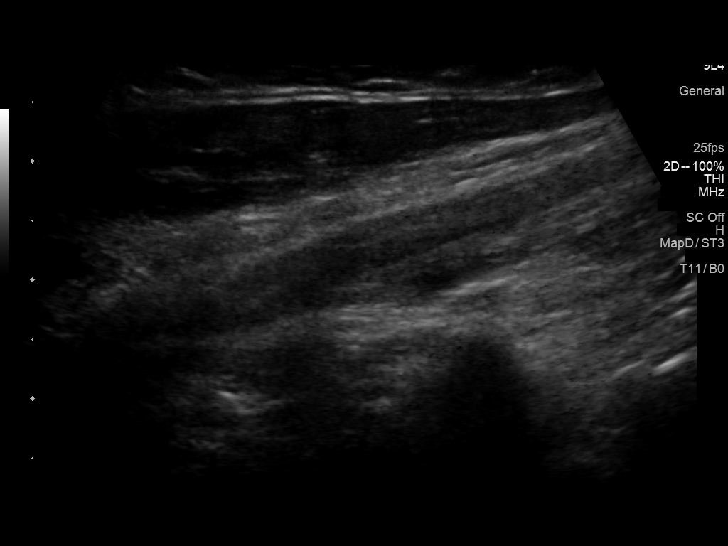

[13 of 25 positions shown; findings below may reference images not displayed]

FINDINGS: Parenchymal Echotexture: Normal

Isthmus: 5 mm

Right lobe: 5.5 x 1.2 x 1.6 cm

Left lobe: 4.1 x 1.6 x 1.6 cm

_________________________________________________________

Estimated total number of nodules >/= 1 cm: 3

Number of spongiform nodules >/=  2 cm not described below (TR1): 0

Number of mixed cystic and solid nodules >/= 1.5 cm not described
below (TR2): 0

_________________________________________________________

Nodule # 1:

Location: Right; Inferior

Maximum size: 1.3 cm; Other 2 dimensions: 1.0 x 0.6 cm

Composition: solid/almost completely solid (2)

Echogenicity: hypoechoic (2)

Shape: not taller-than-wide (0)

Margins: ill-defined (0)

Echogenic foci: none (0)

ACR TI-RADS total points: 4.

ACR TI-RADS risk category: TR4 (4-6 points).

ACR TI-RADS recommendations:

*Given size (>/= 1 - 1.4 cm) and appearance, a follow-up ultrasound
in 1 year should be considered based on TI-RADS criteria.

_________________________________________________________

Nodule # 3:

Location: Left; Mid

Maximum size: 1.2 cm; Other 2 dimensions: 1.2 x 0.8 cm

Composition: solid/almost completely solid (2)

Echogenicity: isoechoic (1)

Shape: not taller-than-wide (0)

Margins: ill-defined (0)

Echogenic foci: none (0)

ACR TI-RADS total points: 3.

ACR TI-RADS risk category: TR3 (3 points).

ACR TI-RADS recommendations:

Given size (<1.4 cm) and appearance, this nodule does NOT meet
TI-RADS criteria for biopsy or dedicated follow-up.

_________________________________________________________

Nodule # 4:

Location: Left; Inferior

Maximum size: 2.0 cm; Other 2 dimensions: 1.5 x 0.7 cm

Composition: solid/almost completely solid (2)

Echogenicity: isoechoic (1)

Shape: not taller-than-wide (0)

Margins: smooth (0)

Echogenic foci: none (0)

ACR TI-RADS total points: 3.

ACR TI-RADS risk category: TR3 (3 points).

ACR TI-RADS recommendations:

*Given size (>/= 1.5 - 2.4 cm) and appearance, a follow-up
ultrasound in 1 year should be considered based on TI-RADS criteria.

_________________________________________________________

Additional subcentimeter right inferior thyroid hypoechoic nodule
noted measuring only 7 mm. This would not meet criteria for any
biopsy or follow-up.

No hypervascularity or regional adenopathy.
IMPRESSION: 1.3 cm right inferior TR 4 nodule and 2 cm left inferior TR 3
nodule. Both meet criteria for follow-up in 1 year.

The above is in keeping with the ACR TI-RADS recommendations - [HOSPITAL] 2131;[DATE].

## 2023-04-21 ENCOUNTER — Ambulatory Visit: Payer: Commercial Managed Care - PPO | Admitting: Family Medicine

## 2023-05-04 ENCOUNTER — Ambulatory Visit: Payer: Commercial Managed Care - PPO | Admitting: Family Medicine

## 2023-10-15 ENCOUNTER — Other Ambulatory Visit: Payer: Self-pay | Admitting: Cardiology
